# Patient Record
Sex: Male | Born: 1945 | Race: Black or African American | Hispanic: No | Smoking: Current some day smoker
Health system: Southern US, Community
[De-identification: ages and names within clinical notes are randomized; demographics above are authoritative.]

## PROBLEM LIST (undated history)

## (undated) DIAGNOSIS — E119 Type 2 diabetes mellitus without complications: Secondary | ICD-10-CM

## (undated) DIAGNOSIS — N410 Acute prostatitis: Secondary | ICD-10-CM

## (undated) DIAGNOSIS — Z8719 Personal history of other diseases of the digestive system: Secondary | ICD-10-CM

## (undated) DIAGNOSIS — I10 Essential (primary) hypertension: Secondary | ICD-10-CM

## (undated) DIAGNOSIS — I2510 Atherosclerotic heart disease of native coronary artery without angina pectoris: Secondary | ICD-10-CM

## (undated) DIAGNOSIS — K219 Gastro-esophageal reflux disease without esophagitis: Secondary | ICD-10-CM

## (undated) DIAGNOSIS — Z86010 Personal history of colonic polyps: Principal | ICD-10-CM

## (undated) DIAGNOSIS — N179 Acute kidney failure, unspecified: Secondary | ICD-10-CM

## (undated) HISTORY — DX: Acute kidney failure, unspecified: N17.9

## (undated) HISTORY — DX: Acute prostatitis: N41.0

## (undated) HISTORY — DX: Personal history of colonic polyps: Z86.010

---
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
## 1998-02-19 ENCOUNTER — Encounter: Admission: RE | Admit: 1998-02-19 | Discharge: 1998-05-20 | Payer: Self-pay | Admitting: Emergency Medicine

## 2004-06-25 ENCOUNTER — Emergency Department (HOSPITAL_COMMUNITY): Admission: EM | Admit: 2004-06-25 | Discharge: 2004-06-25 | Payer: Self-pay | Admitting: Emergency Medicine

## 2007-01-26 ENCOUNTER — Ambulatory Visit: Payer: Self-pay | Admitting: Internal Medicine

## 2007-01-26 ENCOUNTER — Encounter (INDEPENDENT_AMBULATORY_CARE_PROVIDER_SITE_OTHER): Payer: Self-pay | Admitting: Family Medicine

## 2007-01-26 LAB — CONVERTED CEMR LAB: Microalb, Ur: 6.38 mg/dL — ABNORMAL HIGH (ref 0.00–1.89)

## 2007-02-09 ENCOUNTER — Encounter (INDEPENDENT_AMBULATORY_CARE_PROVIDER_SITE_OTHER): Payer: Self-pay | Admitting: Family Medicine

## 2007-02-09 ENCOUNTER — Ambulatory Visit: Payer: Self-pay | Admitting: Internal Medicine

## 2007-02-09 ENCOUNTER — Ambulatory Visit: Payer: Self-pay | Admitting: *Deleted

## 2007-02-09 LAB — CONVERTED CEMR LAB
ALT: 13 units/L (ref 0–53)
AST: 17 units/L (ref 0–37)
Albumin: 4.2 g/dL (ref 3.5–5.2)
Alkaline Phosphatase: 61 units/L (ref 39–117)
Basophils Absolute: 0.1 10*3/uL (ref 0.0–0.1)
Calcium: 9.7 mg/dL (ref 8.4–10.5)
Chloride: 104 meq/L (ref 96–112)
Cholesterol: 201 mg/dL — ABNORMAL HIGH (ref 0–200)
Eosinophils Absolute: 0.4 10*3/uL (ref 0.2–0.7)
Eosinophils Relative: 6 % — ABNORMAL HIGH (ref 0–5)
Glucose, Bld: 146 mg/dL — ABNORMAL HIGH (ref 70–99)
HCT: 45 % (ref 39.0–52.0)
Hemoglobin: 14.1 g/dL (ref 13.0–17.0)
MCHC: 31.3 g/dL (ref 30.0–36.0)
MCV: 92.6 fL (ref 78.0–100.0)
Platelets: 264 10*3/uL (ref 150–400)
Potassium: 4.7 meq/L (ref 3.5–5.3)
Sodium: 140 meq/L (ref 135–145)
Triglycerides: 172 mg/dL — ABNORMAL HIGH (ref <150)

## 2007-02-18 ENCOUNTER — Encounter (INDEPENDENT_AMBULATORY_CARE_PROVIDER_SITE_OTHER): Payer: Self-pay | Admitting: Family Medicine

## 2007-02-18 ENCOUNTER — Ambulatory Visit: Payer: Self-pay | Admitting: Internal Medicine

## 2007-02-18 LAB — CONVERTED CEMR LAB: Helicobacter Pylori Antibody-IgG: >8 — ABNORMAL HIGH

## 2007-05-27 ENCOUNTER — Ambulatory Visit: Payer: Self-pay | Admitting: Family Medicine

## 2008-01-02 ENCOUNTER — Ambulatory Visit: Payer: Self-pay | Admitting: Internal Medicine

## 2008-01-02 ENCOUNTER — Encounter (INDEPENDENT_AMBULATORY_CARE_PROVIDER_SITE_OTHER): Payer: Self-pay | Admitting: Adult Health

## 2008-01-02 LAB — CONVERTED CEMR LAB
Albumin: 4.6 g/dL (ref 3.5–5.2)
Basophils Relative: 1 % (ref 0–1)
Calcium: 9.7 mg/dL (ref 8.4–10.5)
Chloride: 100 meq/L (ref 96–112)
Cholesterol: 236 mg/dL — ABNORMAL HIGH (ref 0–200)
Eosinophils Absolute: 0.4 10*3/uL (ref 0.0–0.7)
HCT: 44.5 % (ref 39.0–52.0)
HDL: 56 mg/dL (ref >39)
LDL Cholesterol: 148 mg/dL — ABNORMAL HIGH (ref 0–99)
MCHC: 34.4 g/dL (ref 30.0–36.0)
Microalb, Ur: 13.2 mg/dL — ABNORMAL HIGH (ref 0.00–1.89)
Potassium: 4.6 meq/L (ref 3.5–5.3)
RBC: 4.96 M/uL (ref 4.22–5.81)
RDW: 12.7 % (ref 11.5–15.5)
Sodium: 137 meq/L (ref 135–145)
Total Protein: 7.8 g/dL (ref 6.0–8.3)
Triglycerides: 158 mg/dL — ABNORMAL HIGH (ref <150)
WBC: 5.6 10*3/uL (ref 4.0–10.5)

## 2008-01-06 ENCOUNTER — Ambulatory Visit: Payer: Self-pay | Admitting: Internal Medicine

## 2008-02-01 ENCOUNTER — Ambulatory Visit: Payer: Self-pay | Admitting: Internal Medicine

## 2008-04-21 ENCOUNTER — Emergency Department (HOSPITAL_COMMUNITY): Admission: EM | Admit: 2008-04-21 | Discharge: 2008-04-21 | Payer: Self-pay | Admitting: Emergency Medicine

## 2008-05-16 ENCOUNTER — Ambulatory Visit: Payer: Self-pay | Admitting: Internal Medicine

## 2008-08-28 ENCOUNTER — Emergency Department (HOSPITAL_COMMUNITY): Admission: EM | Admit: 2008-08-28 | Discharge: 2008-08-28 | Payer: Self-pay | Admitting: Emergency Medicine

## 2008-10-30 ENCOUNTER — Emergency Department (HOSPITAL_COMMUNITY): Admission: EM | Admit: 2008-10-30 | Discharge: 2008-10-30 | Payer: Self-pay | Admitting: Family Medicine

## 2008-12-25 ENCOUNTER — Encounter (INDEPENDENT_AMBULATORY_CARE_PROVIDER_SITE_OTHER): Payer: Self-pay | Admitting: Adult Health

## 2008-12-25 ENCOUNTER — Ambulatory Visit: Payer: Self-pay | Admitting: Family Medicine

## 2008-12-25 LAB — CONVERTED CEMR LAB
ALT: 26 units/L (ref 0–53)
BUN: 19 mg/dL (ref 6–23)
CO2: 24 meq/L (ref 19–32)
Chloride: 106 meq/L (ref 96–112)
Creatinine, Ser: 1.45 mg/dL (ref 0.40–1.50)
Glucose, Bld: 105 mg/dL — ABNORMAL HIGH (ref 70–99)
Microalb, Ur: 20.94 mg/dL — ABNORMAL HIGH (ref 0.00–1.89)
Total Bilirubin: 0.6 mg/dL (ref 0.3–1.2)
Triglycerides: 108 mg/dL (ref <150)

## 2009-01-08 ENCOUNTER — Ambulatory Visit: Payer: Self-pay | Admitting: Internal Medicine

## 2009-04-12 ENCOUNTER — Ambulatory Visit: Payer: Self-pay | Admitting: Internal Medicine

## 2009-04-12 ENCOUNTER — Encounter (INDEPENDENT_AMBULATORY_CARE_PROVIDER_SITE_OTHER): Payer: Self-pay | Admitting: Adult Health

## 2009-04-12 LAB — CONVERTED CEMR LAB
ALT: 13 units/L (ref 0–53)
Albumin: 4.4 g/dL (ref 3.5–5.2)
Alkaline Phosphatase: 76 units/L (ref 39–117)
CO2: 25 meq/L (ref 19–32)
Sodium: 139 meq/L (ref 135–145)
Total Protein: 7.2 g/dL (ref 6.0–8.3)

## 2009-05-16 ENCOUNTER — Observation Stay (HOSPITAL_COMMUNITY): Admission: EM | Admit: 2009-05-16 | Discharge: 2009-05-17 | Payer: Self-pay | Admitting: Emergency Medicine

## 2009-05-16 ENCOUNTER — Ambulatory Visit: Payer: Self-pay | Admitting: Family Medicine

## 2009-07-25 ENCOUNTER — Encounter: Payer: Self-pay | Admitting: *Deleted

## 2010-04-15 NOTE — Miscellaneous (Signed)
Summary: walk in  [facility] came in asking for a referral. he is a [facility] pt. told him even though we saw him in the hospital, he will need to go back to [facility] for the referral. explained that they are his pcp. he was ok with this.Golden Circle RN  Jul 25, 2009 8:54 AM

## 2010-06-09 LAB — CK TOTAL AND CKMB (NOT AT ARMC)
CK, MB: 1.1 ng/mL (ref 0.3–4.0)
Total CK: 125 U/L (ref 7–232)

## 2010-06-09 LAB — BASIC METABOLIC PANEL
BUN: 10 mg/dL (ref 6–23)
CO2: 27 mEq/L (ref 19–32)
Calcium: 8.4 mg/dL (ref 8.4–10.5)
Calcium: 9 mg/dL (ref 8.4–10.5)
GFR calc Af Amer: >60 mL/min (ref >60)
GFR calc Af Amer: >60 mL/min (ref >60)
GFR calc non Af Amer: 52 mL/min — ABNORMAL LOW (ref >60)
GFR calc non Af Amer: 57 mL/min — ABNORMAL LOW (ref >60)
Sodium: 131 mEq/L — ABNORMAL LOW (ref 135–145)
Sodium: 134 mEq/L — ABNORMAL LOW (ref 135–145)

## 2010-06-09 LAB — RAPID URINE DRUG SCREEN, HOSP PERFORMED
Benzodiazepines: NOT DETECTED
Cocaine: POSITIVE — AB
Opiates: NOT DETECTED
Tetrahydrocannabinol: POSITIVE — AB

## 2010-06-09 LAB — CARDIAC PANEL(CRET KIN+CKTOT+MB+TROPI)
CK, MB: 0.9 ng/mL (ref 0.3–4.0)
Total CK: 100 U/L (ref 7–232)
Troponin I: 0.02 ng/mL (ref 0.00–0.06)
Troponin I: 0.04 ng/mL (ref 0.00–0.06)

## 2010-06-09 LAB — POCT CARDIAC MARKERS: Myoglobin, poc: 73.2 ng/mL (ref 12–200)

## 2010-06-09 LAB — GLUCOSE, CAPILLARY
Glucose-Capillary: 103 mg/dL — ABNORMAL HIGH (ref 70–99)
Glucose-Capillary: 227 mg/dL — ABNORMAL HIGH (ref 70–99)
Glucose-Capillary: 235 mg/dL — ABNORMAL HIGH (ref 70–99)
Glucose-Capillary: 345 mg/dL — ABNORMAL HIGH (ref 70–99)

## 2010-06-09 LAB — CBC
HCT: 37.9 % — ABNORMAL LOW (ref 39.0–52.0)
Hemoglobin: 13.1 g/dL (ref 13.0–17.0)
RDW: 12.8 % (ref 11.5–15.5)
WBC: 4.7 10*3/uL (ref 4.0–10.5)

## 2010-06-09 LAB — TROPONIN I: Troponin I: 0.01 ng/mL (ref 0.00–0.06)

## 2010-06-09 LAB — URINALYSIS, ROUTINE W REFLEX MICROSCOPIC
Glucose, UA: >1000 mg/dL — AB
Hgb urine dipstick: NEGATIVE
Ketones, ur: NEGATIVE mg/dL
Protein, ur: NEGATIVE mg/dL

## 2010-06-09 LAB — LIPID PANEL
HDL: 52 mg/dL (ref >39)
VLDL: 23 mg/dL (ref 0–40)

## 2010-06-09 LAB — DIFFERENTIAL
Basophils Relative: 0 % (ref 0–1)
Eosinophils Relative: 10 % — ABNORMAL HIGH (ref 0–5)
Monocytes Relative: 8 % (ref 3–12)

## 2010-06-21 LAB — GC/CHLAMYDIA PROBE AMP, GENITAL: GC Probe Amp, Genital: NEGATIVE

## 2011-06-28 ENCOUNTER — Emergency Department (HOSPITAL_COMMUNITY): Payer: Self-pay

## 2011-06-28 ENCOUNTER — Emergency Department (HOSPITAL_COMMUNITY)
Admission: EM | Admit: 2011-06-28 | Discharge: 2011-06-28 | Disposition: A | Discharge status: Home / Self Care | Payer: Self-pay | Attending: Emergency Medicine | Admitting: Emergency Medicine

## 2011-06-28 ENCOUNTER — Encounter (HOSPITAL_COMMUNITY): Payer: Self-pay | Admitting: *Deleted

## 2011-06-28 DIAGNOSIS — R0602 Shortness of breath: Secondary | ICD-10-CM | POA: Insufficient documentation

## 2011-06-28 DIAGNOSIS — Z9114 Patient's other noncompliance with medication regimen: Secondary | ICD-10-CM

## 2011-06-28 DIAGNOSIS — R739 Hyperglycemia, unspecified: Secondary | ICD-10-CM

## 2011-06-28 DIAGNOSIS — I10 Essential (primary) hypertension: Secondary | ICD-10-CM | POA: Insufficient documentation

## 2011-06-28 DIAGNOSIS — Z91199 Patient's noncompliance with other medical treatment and regimen due to unspecified reason: Secondary | ICD-10-CM | POA: Insufficient documentation

## 2011-06-28 DIAGNOSIS — I2510 Atherosclerotic heart disease of native coronary artery without angina pectoris: Secondary | ICD-10-CM | POA: Insufficient documentation

## 2011-06-28 DIAGNOSIS — Z79899 Other long term (current) drug therapy: Secondary | ICD-10-CM | POA: Insufficient documentation

## 2011-06-28 DIAGNOSIS — R079 Chest pain, unspecified: Secondary | ICD-10-CM | POA: Insufficient documentation

## 2011-06-28 DIAGNOSIS — E119 Type 2 diabetes mellitus without complications: Secondary | ICD-10-CM | POA: Insufficient documentation

## 2011-06-28 DIAGNOSIS — Z9119 Patient's noncompliance with other medical treatment and regimen: Secondary | ICD-10-CM | POA: Insufficient documentation

## 2011-06-28 HISTORY — DX: Essential (primary) hypertension: I10

## 2011-06-28 HISTORY — DX: Atherosclerotic heart disease of native coronary artery without angina pectoris: I25.10

## 2011-06-28 LAB — CK TOTAL AND CKMB (NOT AT ARMC)
CK, MB: 2.1 ng/mL (ref 0.3–4.0)
Relative Index: 0.7 (ref 0.0–2.5)
Relative Index: 0.8 (ref 0.0–2.5)

## 2011-06-28 LAB — DIFFERENTIAL
Basophils Absolute: 0 10*3/uL (ref 0.0–0.1)
Basophils Relative: 1 % (ref 0–1)
Eosinophils Absolute: 0.4 10*3/uL (ref 0.0–0.7)
Lymphs Abs: 1.3 10*3/uL (ref 0.7–4.0)
Neutrophils Relative %: 61 % (ref 43–77)

## 2011-06-28 LAB — COMPREHENSIVE METABOLIC PANEL
ALT: 14 U/L (ref 0–53)
AST: 24 U/L (ref 0–37)
Albumin: 3.8 g/dL (ref 3.5–5.2)
Alkaline Phosphatase: 89 U/L (ref 39–117)
Calcium: 9.2 mg/dL (ref 8.4–10.5)
Potassium: 3.5 mEq/L (ref 3.5–5.1)
Sodium: 136 mEq/L (ref 135–145)
Total Protein: 6.9 g/dL (ref 6.0–8.3)

## 2011-06-28 LAB — POCT I-STAT TROPONIN I: Troponin i, poc: 0.01 ng/mL (ref 0.00–0.08)

## 2011-06-28 LAB — CBC
MCH: 31.4 pg (ref 26.0–34.0)
MCHC: 35.6 g/dL (ref 30.0–36.0)
Platelets: 231 10*3/uL (ref 150–400)
RBC: 4.33 MIL/uL (ref 4.22–5.81)
RDW: 12.3 % (ref 11.5–15.5)

## 2011-06-28 LAB — RAPID URINE DRUG SCREEN, HOSP PERFORMED
Amphetamines: NOT DETECTED
Barbiturates: NOT DETECTED
Cocaine: POSITIVE — AB
Tetrahydrocannabinol: NOT DETECTED

## 2011-06-28 MED ORDER — ASPIRIN 81 MG PO CHEW: 81.0000 mg | CHEWABLE_TABLET | Freq: Every day | ORAL | Status: AC

## 2011-06-28 MED ORDER — METFORMIN HCL 1000 MG PO TABS: 1000.0000 mg | ORAL_TABLET | Freq: Two times a day (BID) | ORAL | Status: DC

## 2011-06-28 MED ORDER — ASPIRIN 81 MG PO CHEW
324.0000 mg | CHEWABLE_TABLET | Freq: Once | ORAL | Status: AC
  Administered 2011-06-28: 324 mg via ORAL
  Filled 2011-06-28: qty 4

## 2011-06-28 MED ORDER — LISINOPRIL 20 MG PO TABS: 10.0000 mg | ORAL_TABLET | Freq: Every day | ORAL | Status: DC

## 2011-06-28 NOTE — ED Notes (Signed)
Dr. Bosie Clos informed & aware of cxr & UDS results

## 2011-06-28 NOTE — Discharge Instructions (Signed)
Arterial Hypertension Arterial hypertension (high blood pressure) is a condition of elevated pressure in your blood vessels. Hypertension over a long period of time is a risk factor for strokes, heart attacks, and heart failure. It is also the leading cause of kidney (renal) failure.  CAUSES   In Adults -- Over 90% of all hypertension has no known cause. This is called essential or primary hypertension. In the other 10% of people with hypertension, the increase in blood pressure is caused by another disorder. This is called secondary hypertension. Important causes of secondary hypertension are:   Heavy alcohol use.   Obstructive sleep apnea.   Hyperaldosterosim (Conn's syndrome).   Steroid use.   Chronic kidney failure.   Hyperparathyroidism.   Medications.   Renal artery stenosis.   Pheochromocytoma.   Cushing's disease.   Coarctation of the aorta.   Scleroderma renal crisis.   Licorice (in excessive amounts).   Drugs (cocaine, methamphetamine).  Your caregiver can explain any items above that apply to you.  In Children -- Secondary hypertension is more common and should always be considered.   Pregnancy -- Few women of childbearing age have high blood pressure. However, up to 10% of them develop hypertension of pregnancy. Generally, this will not harm the woman. It Even be a sign of 3 complications of pregnancy: preeclampsia, HELLP syndrome, and eclampsia. Follow up and control with medication is necessary.  SYMPTOMS   This condition normally does not produce any noticeable symptoms. It is usually found during a routine exam.   Malignant hypertension is a late problem of high blood pressure. It Monger have the following symptoms:   Headaches.   Blurred vision.   End-organ damage (this means your kidneys, heart, lungs, and other organs are being damaged).   Stressful situations can increase the blood pressure. If a person with normal blood pressure has their blood  pressure go up while being seen by their caregiver, this is often termed "white coat hypertension." Its importance is not known. It Alkire be related with eventually developing hypertension or complications of hypertension.   Hypertension is often confused with mental tension, stress, and anxiety.  DIAGNOSIS  The diagnosis is made by 3 separate blood pressure measurements. They are taken at least 1 week apart from each other. If there is organ damage from hypertension, the diagnosis Lemire be made without repeat measurements. Hypertension is usually identified by having blood pressure readings:  Above 140/90 mmHg measured in both arms, at 3 separate times, over a couple weeks.   Over 130/80 mmHg should be considered a risk factor and Grisanti require treatment in patients with diabetes.  Blood pressure readings over 120/80 mmHg are called "pre-hypertension" even in non-diabetic patients. To get a true blood pressure measurement, use the following guidelines. Be aware of the factors that can alter blood pressure readings.  Take measurements at least 1 hour after caffeine.   Take measurements 30 minutes after smoking and without any stress. This is another reason to quit smoking - it raises your blood pressure.   Use a proper cuff size. Ask your caregiver if you are not sure about your cuff size.   Most home blood pressure cuffs are automatic. They will measure systolic and diastolic pressures. The systolic pressure is the pressure reading at the start of sounds. Diastolic pressure is the pressure at which the sounds disappear. If you are elderly, measure pressures in multiple postures. Try sitting, lying or standing.   Sit at rest for a minimum of   5 minutes before taking measurements.   You should not be on any medications like decongestants. These are found in many cold medications.   Record your blood pressure readings and review them with your caregiver.  If you have hypertension:  Your caregiver  may do tests to be sure you do not have secondary hypertension (see "causes" above).   Your caregiver may also look for signs of metabolic syndrome. This is also called Syndrome X or Insulin Resistance Syndrome. You may have this syndrome if you have type 2 diabetes, abdominal obesity, and abnormal blood lipids in addition to hypertension.   Your caregiver will take your medical and family history and perform a physical exam.   Diagnostic tests may include blood tests (for glucose, cholesterol, potassium, and kidney function), a urinalysis, or an EKG. Other tests may also be necessary depending on your condition.  PREVENTION  There are important lifestyle issues that you can adopt to reduce your chance of developing hypertension:  Maintain a normal weight.   Limit the amount of salt (sodium) in your diet.   Exercise often.   Limit alcohol intake.   Get enough potassium in your diet. Discuss specific advice with your caregiver.   Follow a DASH diet (dietary approaches to stop hypertension). This diet is rich in fruits, vegetables, and low-fat dairy products, and avoids certain fats.  PROGNOSIS  Essential hypertension cannot be cured. Lifestyle changes and medical treatment can lower blood pressure and reduce complications. The prognosis of secondary hypertension depends on the underlying cause. Many people whose hypertension is controlled with medicine or lifestyle changes can live a normal, healthy life.  RISKS AND COMPLICATIONS  While high blood pressure alone is not an illness, it often requires treatment due to its short- and long-term effects on many organs. Hypertension increases your risk for:  CVAs or strokes (cerebrovascular accident).   Heart failure due to chronically high blood pressure (hypertensive cardiomyopathy).   Heart attack (myocardial infarction).   Damage to the retina (hypertensive retinopathy).   Kidney failure (hypertensive nephropathy).  Your caregiver can  explain list items above that apply to you. Treatment of hypertension can significantly reduce the risk of complications. TREATMENT   For overweight patients, weight loss and regular exercise are recommended. Physical fitness lowers blood pressure.   Mild hypertension is usually treated with diet and exercise. A diet rich in fruits and vegetables, fat-free dairy products, and foods low in fat and salt (sodium) can help lower blood pressure. Decreasing salt intake decreases blood pressure in a 1/3 of people.   Stop smoking if you are a smoker.  The steps above are highly effective in reducing blood pressure. While these actions are easy to suggest, they are difficult to achieve. Most patients with moderate or severe hypertension end up requiring medications to bring their blood pressure down to a normal level. There are several classes of medications for treatment. Blood pressure pills (antihypertensives) will lower blood pressure by their different actions. Lowering the blood pressure by 10 mmHg may decrease the risk of complications by as much as 25%. The goal of treatment is effective blood pressure control. This will reduce your risk for complications. Your caregiver will help you determine the best treatment for you according to your lifestyle. What is excellent treatment for one person, may not be for you. HOME CARE INSTRUCTIONS   Do not smoke.   Follow the lifestyle changes outlined in the "Prevention" section.   If you are on medications, follow the directions   carefully. Blood pressure medications must be taken as prescribed. Skipping doses reduces their benefit. It also puts you at risk for problems.   Follow up with your caregiver, as directed.   If you are asked to monitor your blood pressure at home, follow the guidelines in the "Diagnosis" section above.  SEEK MEDICAL CARE IF:   You think you are having medication side effects.   You have recurrent headaches or lightheadedness.     You have swelling in your ankles.   You have trouble with your vision.  SEEK IMMEDIATE MEDICAL CARE IF:   You have sudden onset of chest pain or pressure, difficulty breathing, or other symptoms of a heart attack.   You have a severe headache.   You have symptoms of a stroke (such as sudden weakness, difficulty speaking, difficulty walking).  MAKE SURE YOU:   Understand these instructions.   Will watch your condition.   Will get help right away if you are not doing well or get worse.  Document Released: 03/02/2005 Document Revised: 02/19/2011 Document Reviewed: 09/30/2006 [employer] Patient InformatBlood Sugar Monitoring, Adult GLUCOSE METERS FOR SELF-MONITORING OF BLOOD GLUCOSE  It is important to be able to correctly measure your blood sugar (glucose). You can use a blood glucose monitor (a small battery-operated device) to check your glucose level at any time. This allows you and your caregiver to monitor your diabetes and to determine how well your treatment plan is working. The process of monitoring your blood glucose with a glucose meter is called self-monitoring of blood glucose (SMBG). When people with diabetes control their blood sugar, they have better health. To test for glucose with a typical glucose meter, place the disposable strip in the meter. Then place a small sample of blood on the "test strip." The test strip is coated with chemicals that combine with glucose in blood. The meter measures how much glucose is present. The meter displays the glucose level as a number. Several new models can record and store a number of test results. Some models can connect to personal computers to store test results or print them out.  Newer meters are often easier to use than older models. Some meters allow you to get blood from places other than your fingertip. Some new models have automatic timing, error codes, signals, or barcode readers to help with proper adjustment (calibration). Some  meters have a large display screen or spoken instructions for people with visual impairments.  INSTRUCTIONS FOR USING GLUCOSE METERS  Wash your hands with soap and warm water, or clean the area with alcohol. Dry your hands completely.   Prick the side of your fingertip with a lancet (a sharp-pointed tool used by hand).   Hold the hand down and gently milk the finger until a small drop of blood appears. Catch the blood with the test strip.   Follow the instructions for inserting the test strip and using the SMBG meter. Most meters require the meter to be turned on and the test strip to be inserted before applying the blood sample.   Record the test result.   Read the instructions carefully for both the meter and the test strips that go with it. Meter instructions are found in the user manual. Keep this manual to help you solve any problems that may arise. Many meters use "error codes" when there is a problem with the meter, the test strip, or the blood sample on the strip. You will need the manual to understand these error  codes and fix the problem.   New devices are available such as laser lancets and meters that can test blood taken from "alternative sites" of the body, other than fingertips. However, you should use standard fingertip testing if your glucose changes rapidly. Also, use standard testing if:   You have eaten, exercised, or taken insulin in the past 2 hours.   You think your glucose is low.   You tend to not feel symptoms of low blood glucose (hypoglycemia).   You are ill or under stress.   Clean the meter as directed by the manufacturer.   Test the meter for accuracy as directed by the manufacturer.   Take your meter with you to your caregiver's office. This way, you can test your glucose in front of your caregiver to make sure you are using the meter correctly. Your caregiver can also take a sample of blood to test using a routine lab method. If values on the glucose  meter are close to the lab results, you and your caregiver will see that your meter is working well and you are using good technique. Your caregiver will advise you about what to do if the results do not match.  FREQUENCY OF TESTING  Your caregiver will tell you how often you should check your blood glucose. This will depend on your type of diabetes, your current level of diabetes control, and your types of medicines. The following are general guidelines, but your care plan may be different. Record all your readings and the time of day you took them for review with your caregiver.   Diabetes type 1.   When you are using insulin with good diabetic control (either multiple daily injections or via a pump), you should check your glucose 4 times a day.   If your diabetes is not well controlled, you may need to monitor more frequently, including before meals and 2 hours after meals, at bedtime, and occasionally between 2 a.m. and 3 a.m.   You should always check your glucose before a dose of insulin or before changing the rate on your insulin pump.   Diabetes type 2.   Guidelines for SMBG in diabetes type 2 are not as well defined.   If you are on insulin, follow the guidelines above.   If you are on medicines, but not insulin, and your glucose is not well controlled, you should test at least twice daily.   If you are not on insulin, and your diabetes is controlled with medicines or diet alone, you should test at least once daily, usually before breakfast.   A weekly profile will help your caregiver advise you on your care plan. The week before your visit, check your glucose before a meal and 2 hours after a meal at least daily. You may want to test before and after a different meal each day so you and your caregiver can tell how well controlled your blood sugars are throughout the course of a 24 hour period.   Gestational diabetes (diabetes during pregnancy).   Frequent testing is often  necessary. Accurate timing is important.   If you are not on insulin, check your glucose 4 times a day. Check it before breakfast and 1 hour after the start of each meal.   If you are on insulin, check your glucose 6 times a day. Check it before each meal and 1 hour after the first bite of each meal.   General guidelines.   More frequent testing is  required at the start of insulin treatment. Your caregiver will instruct you.   Test your glucose any time you suspect you have low blood sugar (hypoglycemia).   You should test more often when you change medicines, when you have unusual stress or illness, or in other unusual circumstances.  OTHER THINGS TO KNOW ABOUT GLUCOSE METERS  Measurement Range. Most glucose meters are able to read glucose levels over a broad range of values from as low as 0 to as high as 600 mg/dL. If you get an extremely high or low reading from your meter, you should first confirm it with another reading. Report very high or very low readings to your caregiver.   Whole Blood Glucose versus Plasma Glucose. Some older home glucose meters measure glucose in your whole blood. In a lab or when using some newer home glucose meters, the glucose is measured in your plasma (one component of blood). The difference can be important. It is important for you and your caregiver to know whether your meter gives its results as "whole blood equivalent" or "plasma equivalent."   Display of High and Low Glucose Values. Part of learning how to operate a meter is understanding what the meter results mean. Know how high and low glucose concentrations are displayed on your meter.   Factors that Affect Glucose Meter Performance. The accuracy of your test results depends on many factors and varies depending on the brand and type of meter. These factors include:   Low red blood cell count (anemia).   Substances in your blood (such as uric acid, vitamin C, and others).   Environmental factors  (temperature, humidity, altitude).   Name-brand versus generic test strips.   Calibration. Make sure your meter is set up properly. It is a good idea to do a calibration test with a control solution recommended by the manufacturer of your meter whenever you begin using a fresh bottle of test strips. This will help verify the accuracy of your meter.   Improperly stored, expired, or defective test strips. Keep your strips in a dry place with the lid on.   Soiled meter.   Inadequate blood sample.  NEW TECHNOLOGIES FOR GLUCOSE TESTING Alternative site testing Some glucose meters allow testing blood from alternative sites. These include the:  Upper arm.   Forearm.   Base of the thumb.   Thigh.  Sampling blood from alternative sites may be desirable. However, it may have some limitations. Blood in the fingertips show changes in glucose levels more quickly than blood in other parts of the body. This means that alternative site test results may be different from fingertip test results, not because of the meter's ability to test accurately, but because the actual glucose concentration can be different.  Continuous Glucose Monitoring Devices to measure your blood glucose continuously are available, and others are in development. These methods can be more expensive than self-monitoring with a glucose meter. However, it is uncertain how effective and reliable these devices are. Your caregiver will advise you if this approach makes sense for you. IF BLOOD SUGARS ARE CONTROLLED, PEOPLE WITH DIABETES REMAIN HEALTHIER.  SMBG is an important part of the treatment plan of patients with diabetes mellitus. Below are reasons for using SMBG:   It confirms that your glucose is at a specific, healthy level.   It detects hypoglycemia and severe hyperglycemia.   It allows you and your caregiver to make adjustments in response to changes in lifestyle for individuals requiring medicine.  It determines the  need for starting insulin therapy in temporary diabetes that happens during pregnancy (gestational diabetes).  Document Released: 03/05/2003 Document Revised: 02/19/2011 Document Reviewed: 06/26/2010 [facility] Patient Information 2012 [place], [place].Chest Pain (Nonspecific) It is often hard to give a specific diagnosis for the cause of chest pain. There is always a chance that your pain could be related to something serious, such as a heart attack or a blood clot in the lungs. You need to follow up with your caregiver for further evaluation. CAUSES   Heartburn.   Pneumonia or bronchitis.   Anxiety or stress.   Inflammation around your heart (pericarditis) or lung (pleuritis or pleurisy).   A blood clot in the lung.   A collapsed lung (pneumothorax). It can develop suddenly on its own (spontaneous pneumothorax) or from injury (trauma) to the chest.   Shingles infection (herpes zoster virus).  The chest wall is composed of bones, muscles, and cartilage. Any of these can be the source of the pain.  The bones can be bruised by injury.   The muscles or cartilage can be strained by coughing or overwork.   The cartilage can be affected by inflammation and become sore (costochondritis).  DIAGNOSIS  Lab tests or other studies, such as X-rays, electrocardiography, stress testing, or cardiac imaging, may be needed to find the cause of your pain.  TREATMENT   Treatment depends on what may be causing your chest pain. Treatment may include:   Acid blockers for heartburn.   Anti-inflammatory medicine.   Pain medicine for inflammatory conditions.   Antibiotics if an infection is present.   You may be advised to change lifestyle habits. This includes stopping smoking and avoiding alcohol, caffeine, and chocolate.   You may be advised to keep your head raised (elevated) when sleeping. This reduces the chance of acid going backward from your stomach into your esophagus.   Most of the time,  nonspecific chest pain will improve within 2 to 3 days with rest and mild pain medicine.  HOME CARE INSTRUCTIONS   If antibiotics were prescribed, take your antibiotics as directed. Finish them even if you start to feel better.   For the next few days, avoid physical activities that bring on chest pain. Continue physical activities as directed.   Do not smoke.   Avoid drinking alcohol.   Only take over-the-counter or prescription medicine for pain, discomfort, or fever as directed by your caregiver.   Follow your caregiver's suggestions for further testing if your chest pain does not go away.   Keep any follow-up appointments you made. If you do not go to an appointment, you could develop lasting (chronic) problems with pain. If there is any problem keeping an appointment, you must call to reschedule.  SEEK MEDICAL CARE IF:   You think you are having problems from the medicine you are taking. Read your medicine instructions carefully.   Your chest pain does not go away, even after treatment.   You develop a rash with blisters on your chest.  SEEK IMMEDIATE MEDICAL CARE IF:   You have increased chest pain or pain that spreads to your arm, neck, jaw, back, or abdomen.   You develop shortness of breath, an increasing cough, or you are coughing up blood.   You have severe back or abdominal pain, feel nauseous, or vomit.   You develop severe weakness, fainting, or chills.   You have a fever.  THIS IS AN EMERGENCY. Do not wait to see if the  pain will go away. Get medical help at once. Call your local emergency services (911 in [place] Do not drive yourself to the hospital. MAKE SURE YOU:   Understand these instructions.   Will watch your condition.   Will get help right away if you are not doing well or get worse.  Document Released: 12/10/2004 Document Revised: 02/19/2011 Document Reviewed: 10/06/2007 [facility] Patient Information 2012 [place], [place].Aspirin and Your  Heart Aspirin affects the way your blood clots and helps "thin" the blood. Aspirin has many uses in heart disease. It may be used as a primary prevention to help reduce the risk of heart related events. It also can be used as a secondary measure to prevent more heart attacks or to prevent additional damage from blood clots.  ASPIRIN MAY HELP IF YOU:  Have had a heart attack or chest pain.   Have undergone open heart surgery such as CABG (Coronary Artery Bypass Surgery).   Have had coronary angioplasty with or without stents.   Have experienced a stroke or TIA (transient ischemic attack).   Have peripheral vascular disease (PAD).   Have chronic heart rhythm problems such as atrial fibrillation.   Are at risk for heart disease.  BEFORE STARTING ASPIRIN Before you start taking aspirin, your caregiver will need to review your medical history. Many things will need to be taken into consideration, such as:  Smoking status.   Blood pressure.   Diabetes.   Gender.   Weight.   Cholesterol level.  ASPIRIN DOSES  Aspirin should only be taken on the advice of your caregiver. Talk to your caregiver about how much aspirin you should take. Aspirin comes in different doses such as:   81 mg.   162 mg.   325 mg.   The aspirin dose you take may be affected by many factors, some of which include:   Your current medications, especially if your are taking blood-thinners or anti-platelet medicine.   Liver function.   Heart disease risk.   Age.   Aspirin comes in two forms:   Non-enteric-coated. This type of aspirin does not have a coating and is absorbed faster. Non-enteric coated aspirin is recommended for patients experiencing chest pain symptoms. This type of aspirin also comes in a chewable form.   Enteric-coated. This means the aspirin has a special coating that releases the medicine very slowly. Enteric-coated aspirin causes less stomach upset. This type of aspirin should not be  chewed or crushed.  ASPIRIN SIDE EFFECTS Daily use of aspirin can increase your risk of serious side effects, some of these include:  Increased bleeding. This can range from a cut that does not stop bleeding to more serious problems such as stomach bleeding or bleeding into the brain (Intracerebral bleeding).   Increased bruising.   Stomach upset.   An allergic reaction such as red, itchy skin.   Increased risk of bleeding when combined with non-steroidal anti-inflammatory medicine (NSAIDS).   Alcohol should be drank in moderation when taking aspirin. Alcohol can increase the risk of stomach bleeding when taken with aspirin.   Aspirin should not be given to children less than 42 years of age due to the association of Reye syndrome. Reye syndrome is a serious illness that can affect the brain and liver. Studies have linked Reye syndrome with aspirin use in children.   People that have nasal polyps have an increased risk of developing an aspirin allergy.  SEEK MEDICAL CARE IF:   You develop an allergic reaction such  as:   Hives.   Itchy skin.   Swelling of the lips, tongue or face.   You develop stomach pain.   You have unusual bleeding or bruising.   You have ringing in your ears.  SEEK IMMEDIATE MEDICAL CARE IF:   You have severe chest pain, especially if the pain is crushing or pressure-like and spreads to the arms, back, neck, or jaw. THIS IS AN EMERGENCY. Do not wait to see if the pain will go away. Get medical help at once. Call your local emergency services (911 in the [place] DO NOT drive yourself to the hospital.   You have stroke-like symptoms such as:   Loss of vision.   Difficulty talking.   Numbness or weakness on one side of your body.   Numbness or weakness in your arm or leg.   Not thinking clearly or feeling confused.   Your bowel movements are bloody, dark red or black in color.   You vomit or cough up blood.   You have blood in your urine.   You  have shortness of breath, coughing or wheezing.  MAKE SURE YOU:   Understand these instructions.   Will monitor your condition.   Seek immediate medical care if necessary.  Document Released: 02/13/2008 Document Revised: 02/19/2011 Document Reviewed: 02/13/2008 [facility] Patient Information 2012 [place], [place].Cardiac Biomarkers Cardiac biomarkers are enzymes, proteins, and hormones that are associated with heart function, damage or failure. Some of the tests are specific for the heart while others are also elevated with skeletal muscle damage. Cardiac biomarkers are used for diagnostic and prognostic purposes and are frequently ordered by caregivers when someone comes into the Emergency Room complaining of symptoms, such as chest pain, pressure, nausea, and shortness of breath. These tests are ordered, along with other laboratory and non-laboratory tests, to detect heart failure (which is often a chronic, progressive condition affecting the ability of the heart to fill with blood and pump efficiently) and the acute coronary syndromes (ACS) as well as to help determine prognosis for people who have had a heart attack. ACS is a group of symptoms that reflect a sudden decrease in the amount of blood and oxygen, also termed 'ischemia,' reaching the heart. This decrease is frequently due to either a narrowing of the coronary arteries (atherosclerosis or vessel spasm) or unstable plaques, which can cause a blood clot (thrombus) and blockage of blood flow. If the oxygen supply is low, it can cause angina (pain); if blood flow is reduced, it can cause death of heart cells (called myocardial infarction or heart attack) and can lead to death of the affected heart muscle cells and to permanent damage and scarring of the heart.  The goal with cardiac biomarkers is to be able to detect the presence and severity of an acute heart condition as soon as possible so that appropriate treatment can be initiated.  There are  only a few cardiac biomarkers that are being routinely used by physicians. Some have been phased out because they are not as specific as the marker of choice - troponin. Many other potential cardiac biomarkers are still being researched but their clinical utility has yet to be established.  Note: Cardiac biomarkers are not the same tests as those that are used to screen the general healthy population for their risk of developing heart disease. Those can be found under Cardiac Risk Assessment. LABORATORY TESTS CURRENT CARDIAC BIOMARKERS   CK and CK-MB   BNP or (NT-proBNP)   Troponin  Myoglobin (not always used; sometimes ordered with troponin)  MORE GENERAL TESTS FREQUENTLY ORDERED ALONG WITH CARDIAC BIOMARKERS   Blood Gases   CMP   BMP   Electrolytes   CBC  ON THE HORIZON Ischemia modified albumin (IMA) - Test has received FDA approval for use with troponin and electrocardiogram to rule out acute coronary syndrome (ACS) in patients with chest pain. May become useful for identifying patients at higher risk of heart attack and potentially could replace myoglobin one day.  NON-LABORATORY TESTS These tests allow caregivers to look at the size, shape, and function of the heart as it is beating. They can be used to detect changes to the rhythm of the heart as well as to detect and evaluate damaged tissues and blocked arteries.   EKG (ECG, electrocardiogram)   Coronary angiography (or arteriography)   Stress testing   Nuclear scan   ECG (echocardiogram)   Chest X-ray  THE FOLLOWING SUMMARIZES CURRENTLY USED CARDIAC BIOMARKERS. Marker: CK  What: Enzyme that exists in three different isoforms   Where Found: Heart, brain, and skeletal muscle   What Indicates: Injury to muscle cells   Time to Increase: 4 to 6 hours after injury, peaks in 18 to 24 hours   Time back to Normal: Normal in 48 to 72 hours, unless due to continuing injury   When/How Used: Being phased out, may be  ordered prior to CK-MB  Marker: CK-MB  What: Heart- related portion of total CK enzyme   Where Found: Heart primarily, but also in skeletal muscle   What Indicates: Injury (cell death) to heart   Time to Increase: 4 to 6 hrs after heart attack, peaks in 12 to 20 hours   Time back to Normal: Returns to normal in 24 to 48 hours unless new/continual damage   When/How Used: Not as specific as Troponin for heart injury/attack, may be ordered when Troponin is not available, may be ordered to monitor new/continuing damage  Marker: Myoglobin  What: Small oxygen-storing protein   Where Found: Heart and other muscle cells   What Indicates: Injury to heart or other muscle cells. Also elevated with kidney problems.   Time to Increase: Starts to rise within 2 to 3 hours, peaks in 8 to 12 hours.   Time back to Normal: Falls back to normal by about one day after injury occurred   When/How Used: Ordered along with Troponin, helps diagnose heart injury/attack  Marker: Cardiac Troponin  What: Components of a Regulatory protein complex. Two cardiac specific isoforms: T and I   Where Found: Heart muscle   What Indicates: Heart injury/damage   Time to Increase: 4 to 8 hours   Time back to Normal: Remains elevated for 7 to 14 days   When/How Used: Ordered to help assess prognosis and diagnose heart attack  Marker: LDH  What: Enzyme   Where Found: Almost all body tissues   What Indicates: General marker of injury to cells   When/How Used: Phased out, not specific  Marker: AST  What: Enzyme   Where Found: Almost all body tissues   What Indicates: General marker of injury to cells   When/How Used: Phased out, not specific  Marker: Hs-CRP  What: Protein   Where Found: Associated with athero-sclerosis   What Indicates: Inflammatory process   Time back to Normal: Elevated with inflammation   When/How Used: May help determine prognosis of patients who have had heart attack    Marker: BNP  What: Hormone  Where Found: Heart's left ventricle   What Indicates: Heart failure   Time back to Normal: Elevation related to severity   When/How Used: Help diagnose and evaluate heart failure, prognosis, and to monitor therapy  Document Released: 03/25/2004 Document Revised: 02/19/2011 Document Reviewed: 12/10/2004 [facility] Patient Information 2012 [place], [place].Diabetes, Frequently Asked Questions WHAT IS DIABETES? Most of the food we eat is turned into glucose (sugar). Our bodies use it for energy. The pancreas makes a hormone called insulin. It helps glucose get into the cells of our bodies. When you have diabetes, your body either does not make enough insulin or cannot use its own insulin as well as it should. This causes sugars to build up in your blood. WHAT ARE THE SYMPTOMS OF DIABETES?  Frequent urination.   Excessive thirst.   Unexplained weight loss.   Extreme hunger.   Blurred vision.   Tingling or numbness in hands or feet.   Feeling very tired much of the time.   Dry, itchy skin.   Sores that are slow to heal.   Yeast infections.  WHAT ARE THE TYPES OF DIABETES? Type 1 Diabetes   About 10% of affected people have this type.   Usually occurs before the age of 10.   Usually occurs in thin to normal weight people.  Type 2 Diabetes  About 90% of affected people have this type.   Usually occurs after the age of 68.   Usually occurs in overweight people.   More likely to have:   A family history of diabetes.   A history of diabetes during pregnancy (gestational diabetes).   High blood pressure.   High cholesterol and triglycerides.  Gestational Diabetes  Occurs in about 4% of pregnancies.   Usually goes away after the baby is born.   More likely to occur in women with:   Family history of diabetes.   Previous gestational diabetes.   Obese.   Over 67 years old.  WHAT IS PRE-DIABETES? Pre-diabetes means your blood  glucose is higher than normal, but lower than the diabetes range. It also means you are at risk of getting type 2 diabetes and heart disease. If you are told you have pre-diabetes, have your blood glucose checked again in 1 to 2 years. WHAT IS THE TREATMENT FOR DIABETES? Treatment is aimed at keeping blood glucose near normal levels at all times. Learning how to manage this yourself is important in treating diabetes. Depending on the type of diabetes you have, your treatment will include one or more of the following:  Monitoring your blood glucose.   Meal planning.   Exercise.   Oral medicine (pills) or insulin.  CAN DIABETES BE PREVENTED? With type 1 diabetes, prevention is more difficult, because the triggers that cause it are not yet known. With type 2 diabetes, prevention is more likely, with lifestyle changes:  Maintain a healthy weight.   Eat healthy.   Exercise.  IS THERE A CURE FOR DIABETES? No, there is no cure for diabetes. There is a lot of research going on that is looking for a cure, and progress is being made. Diabetes can be treated and controlled. People with diabetes can manage their diabetes and lead normal, active lives. SHOULD I BE TESTED FOR DIABETES? If you are at least 65 years old, you should be tested for diabetes. You should be tested again every 3 years. If you are 45 or older and overweight, you may want to get tested more often. If you are  younger than 45, overweight, and have one or more of the following risk factors, you should be tested:  Family history of diabetes.   Inactive lifestyle.   High blood pressure.  WHAT ARE SOME OTHER SOURCES FOR INFORMATION ON DIABETES? The following organizations may help in your search for more information on diabetes: National Diabetes Education Program (NDEP) Internet: [employer] American Diabetes Association Internet: http://www.diabetes.org  Juvenile Diabetes Foundation  International Internet: [employer] Document Released: 03/05/2003 Document Revised: 02/19/2011 Document Reviewed: 12/28/2008 [facility] Patient Information 2012 [place], [place].Diabetes, Type 2 Diabetes is a long-lasting (chronic) disease. In type 2 diabetes, the pancreas does not make enough insulin (a hormone), and the body does not respond normally to the insulin that is made. This type of diabetes was also previously called adult-onset diabetes. It usually occurs after the age of 65, but it can occur at any age.  CAUSES  Type 2 diabetes happens because the pancreasis not making enough insulin or your body has trouble using the insulin that your pancreas does make properly. SYMPTOMS   Drinking more than usual.   Urinating more than usual.   Blurred vision.   Dry, itchy skin.   Frequent infections.   Feeling more tired than usual (fatigue).  DIAGNOSIS The diagnosis of type 2 diabetes is usually made by one of the following tests:  Fasting blood glucose test. You will not eat for at least 8 hours and then take a blood test.   Random blood glucose test. Your blood glucose (sugar) is checked at any time of the day regardless of when you ate.   Oral glucose tolerance test (OGTT). Your blood glucose is measured after you have not eaten (fasted) and then after you drink a glucose containing beverage.  TREATMENT   Healthy eating.   Exercise.   Medicine, if needed.   Monitoring blood glucose.   Seeing your caregiver regularly.  HOME CARE INSTRUCTIONS   Check your blood glucose at least once a day. More frequent monitoring may be necessary, depending on your medicines and on how well your diabetes is controlled. Your caregiver will advise you.   Take your medicine as directed by your caregiver.   Do not smoke.   Make wise food choices. Ask your caregiver for information. Weight loss can improve your diabetes.   Learn about low blood glucose (hypoglycemia) and how to  treat it.   Get your eyes checked regularly.   Have a yearly physical exam. Have your blood pressure checked and your blood and urine tested.   Wear a pendant or bracelet saying that you have diabetes.   Check your feet every night for cuts, sores, blisters, and redness. Let your caregiver know if you have any problems.  SEEK MEDICAL CARE IF:   You have problems keeping your blood glucose in target range.   You have problems with your medicines.   You have symptoms of an illness that do not improve after 24 hours.   You have a sore or wound that is not healing.   You notice a change in vision or a new problem with your vision.   You have a fever.  MAKE SURE YOU:  Understand these instructions.   Will watch your condition.   Will get help right away if you are not doing well or get worse.  Document Released: 03/02/2005 Document Revised: 02/19/2011 Document Reviewed: 08/18/2010 [facility] Patient Information [address]ion 2012 [place], [place].

## 2011-06-28 NOTE — Consult Note (Signed)
Hospital Admission Note Date: 06/28/2011  Patient name: Leonard Stone Medical record number: [id number] Date of birth: 07-03-1945 Age: 66 y.o. Gender: male PCP: No primary provider on file.  Medical Service: Internal Medicine Teaching Service  Attending physician:  Dr. Josem Kaufmann   1st Contact: Dr. Bosie Clos   Pager: [phone] 2nd Contact: Dr. Anselm Jungling    Pager:[phone] After 5 pm or weekends: 1st Contact:      Pager: [phone] 2nd Contact:      Pager: [phone]  Chief Complaint: chest pain  History of Present Illness: Pt was in his typical state of health until 12:30 on the morning of admission when he began experiencing chest pain that he described as a pressure at the center of his chest.  Notably, he reports using cocaine three hours prior to onset and Vicodin 30 minutes from the onset.  The pain does no radiate, there are no exacerbating factors (including movement), and no alleviating factors (including rest).  He claims that the pain remained constant until he arrived at the ED around 0400.  In the ED he received only 324 mg of Aspirin and his pain gradually subsided.  Currently without any pain.  He denies shortness of breath, cough, sweating, fever, nausea/vomiting, lower extremity swelling/pain, and dizziness.   Meds: Medications Prior to Admission  Medication Sig Dispense Refill  . ranitidine (ZANTAC) 150 MG tablet Take 150 mg by mouth daily.      Marland Kitchen lisinopril (PRINIVIL,ZESTRIL) 20 MG tablet Take 0.5 tablets (10 mg total) by mouth daily.  30 tablet  1  . metFORMIN (GLUCOPHAGE) 1000 MG tablet Take 1 tablet (1,000 mg total) by mouth 2 (two) times daily with a meal.  60 tablet  1   Medications Prior to Admission  Medication Dose Route Frequency Provider Last Rate Last Dose  . aspirin chewable tablet 324 mg  324 mg Oral Once Felisa Bonier, MD   324 mg at 06/28/11 6578    Allergies: Allergies as of 06/28/2011  . (No Known Allergies)   Past Medical History  Diagnosis Date  . Hypertension    . Coronary artery disease   . Diabetes mellitus    History reviewed. No pertinent past surgical history. No family history on file. History   Social History  . Marital Status: Legally Separated    Spouse Name: N/A    Number of Children: N/A  . Years of Education: N/A   Occupational History  . Not on file.   Social History Main Topics  . Smoking status: Current Everyday Smoker  . Smokeless tobacco: Not on file  . Alcohol Use: Yes  . Drug Use:   . Sexually Active:    Other Topics Concern  . Not on file   Social History Narrative  . No narrative on file    Review of Systems: Gen: No fevers, no fatigue HEENT: No headaches, no sore throat CV: Positive Chest Pain, no palpitations, no DOE Resp: No SOB, no cough ABD: No abdominal pain, no nausea, no vomiting, no changes in bowel habits Urinary: No dysuria, no increase frequency Ext: no extremity swelling, positive foot pain  Physical Exam: Blood pressure 135/82, pulse 74, temperature 98 F (36.7 C), temperature source Oral, resp. rate 16, SpO2 100.00%. BP 135/82  Pulse 74  Temp(Src) 98 F (36.7 C) (Oral)  Resp 16  SpO2 100% General appearance: alert and cooperative Head: Normocephalic, without obvious abnormality, atraumatic Eyes: conjunctivae/corneas clear. PERRL, EOM's intact. Fundi benign. Nose: no discharge Throat: lips, mucosa, and tongue  normal; teeth and gums normal Neck: no carotid bruit, no JVD and supple, symmetrical, trachea midline Lungs: clear to auscultation bilaterally Chest wall: no tenderness Heart: regular rate and rhythm, S1, S2 normal, no murmur, click, rub or gallop Abdomen: soft, non-tender; bowel sounds normal; no masses,  no organomegaly Extremities: extremities normal, atraumatic, no cyanosis or edema Pulses: 2+ and symmetric Skin: Skin color, texture, turgor normal. No rashes or lesions Neurologic: Grossly normal, alert and oriented x3 Psych: Appropriate Mood and Affect  Lab  results: Basic Metabolic Panel:  Basename 06/28/11 0453  NA 136  K 3.5  CL 101  CO2 24  GLUCOSE 261*  BUN 11  CREATININE 1.18  CALCIUM 9.2  MG --  PHOS --   Liver Function Tests:  [facility] 06/28/11 0453  AST 24  ALT 14  ALKPHOS 89  BILITOT 0.6  PROT 6.9  ALBUMIN 3.8   CBC:  Basename 06/28/11 0453  WBC 5.5  NEUTROABS 3.3  HGB 13.6  HCT 38.2*  MCV 88.2  PLT 231   Cardiac Enzymes:  Basename 06/28/11 0734 06/28/11 0453  CKTOTAL 266* 301*  CKMB 2.1 2.2  CKMBINDEX -- --  TROPONINI -- --   Urine Drug Screen: Drugs of Abuse     Component Value Date/Time   LABOPIA NONE DETECTED 06/28/2011 1038   COCAINSCRNUR POSITIVE* 06/28/2011 1038   LABBENZ NONE DETECTED 06/28/2011 1038   AMPHETMU NONE DETECTED 06/28/2011 1038   THCU NONE DETECTED 06/28/2011 1038   LABBARB NONE DETECTED 06/28/2011 1038    Imaging results:  Dg Chest 2 View  06/28/2011  *RADIOLOGY REPORT*  Clinical Data: Chest pain  CHEST - 2 VIEW  Comparison: 05/16/2009  Findings: The cardiomediastinal silhouette is unremarkable. The lungs are clear. There is no evidence of focal airspace disease, pulmonary edema, suspicious pulmonary nodule/mass, pleural effusion, or pneumothorax. No acute bony abnormalities are identified.  IMPRESSION: No evidence of acute cardiopulmonary disease.  Original Report Authenticated By: Rosendo Gros, M.D.    Other results: EKG: normal EKG, normal sinus rhythm, unchanged from previous tracings.  Assessment & Plan by Problem: 1) Chest Pain: Most likely cocaine induced pain.  Two negative sets of cardiac enzymes suggest the pain is less likely due to myocardial infarction.  Pt is without shortness of breath, dyspnea on exertion nor cough with Geneva score of 0 indicating very low probability of pulmonary embolism. In addition, his chest XRay was without acute findings thus pulmonary origin, aortic defects and heart failure is unlikely.   The description of the pain and the fact that it  has resolved so acutely makes an MSK etiology unlikely.  He was advised to be allow admission to further assess his cardiac status but pt declined specifying that he felt fine with total resolution of symptoms "hours ago" and the need to return to work and get his car out of the vehicle impound. Pt voiced understanding of medical advisement to be admitted to the hospital for a cardiac rule out and decided to leave against medical advise.  He voiced understanding that leaving AMA could result in death, deterioration of his condition, and worsened cardiac and pulmonary morbidity.  He was seen by the social worker in the ED. He was instructed to seek resources to help him stop using cocaine and to continue his home medications as prescribed, including a daily 81 mg Aspirin.  He was further instructed to seek immediate medical attention if his condition worsened, his chest pain returned, or if he became short of  breath.   He was instructed to follow-up with [facility] for the next available appointment  And voice understanding.       Signed: HO,MICHELE 06/28/2011, 12:57 PM

## 2011-06-28 NOTE — Progress Notes (Signed)
06/28/11 0939  Discharge Planning  Type of Residence Private residence  Living Arrangements Alone  Home Care Services No  Support Systems Other relatives  Do you have any problems obtaining your medications? Yes (Describe) (Per EMR, pt is unfunded)  Once you are discharged, how will you get to your follow-up appointment? Self  Expected Discharge Date 07/01/11  Case Management Consult Needed Yes (Comment)  Social Work Consult Needed No    Dionne Milo MSW [facility] Emergency Dept. Weekend/Social Worker [phone]
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
## 2011-06-28 NOTE — ED Notes (Signed)
The pt has had lt upper chest pain for the past 3 hours.  bp high

## 2011-06-28 NOTE — ED Notes (Signed)
Patient transported to X-ray 

## 2011-06-28 NOTE — ED Notes (Signed)
Report received, assumed care.  

## 2011-06-28 NOTE — ED Notes (Signed)
Paged [phone] to [phone] 

## 2011-06-28 NOTE — ED Notes (Signed)
Pt c/o intermittent substernal CP since taking one of his pain pills. Pain fills like pressure, with mild nausea. Denies vomiting, diaphoresis. Nothing makes the pain feel worse and nothing makes the pain feel better

## 2011-06-28 NOTE — ED Notes (Signed)
Admitting MD at bedside.

## 2011-06-28 NOTE — ED Notes (Signed)
Diet received

## 2011-06-29 NOTE — Consult Note (Signed)
Leonard Stone left against medical advice before I could interview and examine him.

## 2012-07-04 ENCOUNTER — Emergency Department (HOSPITAL_COMMUNITY): Payer: Medicare Other

## 2012-07-04 ENCOUNTER — Encounter: Payer: Self-pay | Admitting: Emergency Medicine

## 2012-07-04 ENCOUNTER — Emergency Department (HOSPITAL_COMMUNITY)
Admission: EM | Admit: 2012-07-04 | Discharge: 2012-07-04 | Discharge status: Against Medical Advice | Payer: Medicare Other | Attending: Emergency Medicine | Admitting: Emergency Medicine

## 2012-07-04 ENCOUNTER — Encounter (HOSPITAL_COMMUNITY): Payer: Self-pay | Admitting: *Deleted

## 2012-07-04 DIAGNOSIS — Y9289 Other specified places as the place of occurrence of the external cause: Secondary | ICD-10-CM | POA: Insufficient documentation

## 2012-07-04 DIAGNOSIS — I10 Essential (primary) hypertension: Secondary | ICD-10-CM | POA: Insufficient documentation

## 2012-07-04 DIAGNOSIS — Y9389 Activity, other specified: Secondary | ICD-10-CM | POA: Insufficient documentation

## 2012-07-04 DIAGNOSIS — F17200 Nicotine dependence, unspecified, uncomplicated: Secondary | ICD-10-CM | POA: Insufficient documentation

## 2012-07-04 DIAGNOSIS — I2510 Atherosclerotic heart disease of native coronary artery without angina pectoris: Secondary | ICD-10-CM | POA: Insufficient documentation

## 2012-07-04 DIAGNOSIS — R0602 Shortness of breath: Secondary | ICD-10-CM | POA: Insufficient documentation

## 2012-07-04 DIAGNOSIS — S298XXA Other specified injuries of thorax, initial encounter: Secondary | ICD-10-CM | POA: Insufficient documentation

## 2012-07-04 DIAGNOSIS — W1809XA Striking against other object with subsequent fall, initial encounter: Secondary | ICD-10-CM | POA: Insufficient documentation

## 2012-07-04 DIAGNOSIS — E119 Type 2 diabetes mellitus without complications: Secondary | ICD-10-CM | POA: Insufficient documentation

## 2012-07-04 NOTE — ED Notes (Signed)
Pt fell last nite on hit left rib area on the curb.  Pt states hurts to take deep breath

## 2012-07-05 ENCOUNTER — Encounter (HOSPITAL_COMMUNITY): Payer: Self-pay | Admitting: Nurse Practitioner

## 2012-07-05 ENCOUNTER — Emergency Department (HOSPITAL_COMMUNITY)
Admission: EM | Admit: 2012-07-05 | Discharge: 2012-07-05 | Disposition: A | Discharge status: Home / Self Care | Payer: Medicare Other | Attending: Emergency Medicine | Admitting: Emergency Medicine

## 2012-07-05 DIAGNOSIS — E119 Type 2 diabetes mellitus without complications: Secondary | ICD-10-CM | POA: Insufficient documentation

## 2012-07-05 DIAGNOSIS — Y939 Activity, unspecified: Secondary | ICD-10-CM | POA: Insufficient documentation

## 2012-07-05 DIAGNOSIS — S20212A Contusion of left front wall of thorax, initial encounter: Secondary | ICD-10-CM

## 2012-07-05 DIAGNOSIS — I2510 Atherosclerotic heart disease of native coronary artery without angina pectoris: Secondary | ICD-10-CM | POA: Insufficient documentation

## 2012-07-05 DIAGNOSIS — S20219A Contusion of unspecified front wall of thorax, initial encounter: Secondary | ICD-10-CM | POA: Insufficient documentation

## 2012-07-05 DIAGNOSIS — Y9289 Other specified places as the place of occurrence of the external cause: Secondary | ICD-10-CM | POA: Insufficient documentation

## 2012-07-05 DIAGNOSIS — F17200 Nicotine dependence, unspecified, uncomplicated: Secondary | ICD-10-CM | POA: Insufficient documentation

## 2012-07-05 DIAGNOSIS — Z79899 Other long term (current) drug therapy: Secondary | ICD-10-CM | POA: Insufficient documentation

## 2012-07-05 DIAGNOSIS — W101XXA Fall (on)(from) sidewalk curb, initial encounter: Secondary | ICD-10-CM | POA: Insufficient documentation

## 2012-07-05 DIAGNOSIS — I10 Essential (primary) hypertension: Secondary | ICD-10-CM | POA: Insufficient documentation

## 2012-07-05 MED ORDER — OXYCODONE-ACETAMINOPHEN 5-325 MG PO TABS: 1.0000 | ORAL_TABLET | ORAL | Status: DC | PRN

## 2012-07-05 MED ORDER — IBUPROFEN 800 MG PO TABS: 800.0000 mg | ORAL_TABLET | Freq: Three times a day (TID) | ORAL | Status: DC

## 2012-07-05 NOTE — ED Notes (Signed)
States he fell off the curb Sunday and having upper body pain since. Was seen here yesterday and xrays were done but pt could not wait for results and left before he saw the doctor. Ambulatory, mae

## 2012-07-05 NOTE — ED Provider Notes (Signed)
History    This chart was scribed for non-physician practitioner Elpidio Anis, PA-C working with Celene Kras, MD by Gerlean Ren, ED Scribe. This patient was seen in room TR07C/TR07C and the patient's care was started at 4:42 PM.   CSN: [id number]  Arrival date & time 07/05/12  1357   First MD Initiated Contact with Patient 07/05/12 1520      Chief Complaint  Patient presents with  . Fall     The history is provided by the patient. No language interpreter was used.  Leonard Stone is a 67 y.o. male who presents to the Emergency Department complaining of constant, aching left lateral rib pain with sudden osnet after falling yesterday morning making trauma to the area with the streetside curb.  Pain worsened by deep breaths.  Pt able to ambulate.  No further injuries as result of the fall.  Pt was seen here yesterday and had negative XR of left lateral ribs but states he left AMA and has returned.  Pt has h/o DM and takes oxycodone daily for peripheral neuropathy but states he cannot refill that prescription for next couple of weeks.  Oxycodone written by Dr. Clide Deutscher.   Past Medical History  Diagnosis Date  . Hypertension   . Coronary artery disease   . Diabetes mellitus     Past Surgical History  Procedure Laterality Date  . Appendectomy      History reviewed. No pertinent family history.  History  Substance Use Topics  . Smoking status: Current Every Day Smoker  . Smokeless tobacco: Not on file  . Alcohol Use: Yes     Comment: occ      Review of Systems  HENT: Negative for neck pain.   Respiratory: Negative for shortness of breath.   Gastrointestinal: Negative for abdominal pain.  Musculoskeletal: Negative for back pain.       Positive left chest wall tenderness    Allergies  Vicodin  Home Medications   Current Outpatient Rx  Name  Route  Sig  Dispense  Refill  . lisinopril (PRINIVIL,ZESTRIL) 20 MG tablet   Oral   Take 0.5 tablets (10 mg total) by mouth daily.   30 tablet   1   . metFORMIN (GLUCOPHAGE) 1000 MG tablet   Oral   Take 1 tablet (1,000 mg total) by mouth 2 (two) times daily with a meal.   60 tablet   1   . Oxycodone HCl 10 MG TABS   Oral   Take 10 mg by mouth every 6 (six) hours as needed (for pain).         . ranitidine (ZANTAC) 150 MG tablet   Oral   Take 150 mg by mouth daily.           BP 151/89  Pulse 78  Temp(Src) 98.4 F (36.9 C) (Oral)  Resp 16  SpO2 99%  Physical Exam  Nursing note and vitals reviewed. Constitutional: He is oriented to person, place, and time. He appears well-developed and well-nourished. No distress.  HENT:  Head: Normocephalic and atraumatic.  Eyes: EOM are normal.  Neck: Neck supple. No tracheal deviation present.  Cervical spine non-tender  Cardiovascular: Normal rate.   Pulmonary/Chest: Effort normal and breath sounds normal. No respiratory distress. He has no wheezes. He has no rales.  Chest wall appears atraumatic, tender in mid axillary wall and left chest wall  Abdominal: Soft. He exhibits no distension. There is no tenderness. There is no rebound and no guarding.  Musculoskeletal: Normal range of motion.  Neurological: He is alert and oriented to person, place, and time.  Skin: Skin is warm and dry.  Psychiatric: He has a normal mood and affect. His behavior is normal.    ED Course  Procedures (including critical care time) DIAGNOSTIC STUDIES: Oxygen Saturation is 99% on room air, normal by my interpretation.    COORDINATION OF CARE: 4:46 PM- Informed pt I can write short course Percocet but that pain management for peripheral neuropathy must be followed by Dr. Clide Deutscher.  Pt verbalizes understanding and agrees with plan.     Dg Ribs Unilateral W/chest Left  07/04/2012  *RADIOLOGY REPORT*  Clinical Data: Fall.  Left chest pain  LEFT RIBS AND CHEST - 3+ VIEW  Comparison: 06/28/2011  Findings: The lungs are clear.  Negative for infiltrate or effusion.  Pneumothorax.  Negative  for heart failure.  Negative for left rib fracture.  IMPRESSION: No acute abnormality.   Original Report Authenticated By: Janeece Riggers, M.D.      No diagnosis found.   1. Contusion chest wall 2. Fall from curb MDM  Negative x-ray - no visible chest wall trauma. Stable for discharge.      I personally performed the services described in this documentation, which was scribed in my presence. The recorded information has been reviewed and is accurate.     Arnoldo Hooker, PA-C 07/05/12 1653

## 2012-07-05 NOTE — ED Provider Notes (Signed)
Medical screening examination/treatment/procedure(s) were performed by non-physician practitioner and as supervising physician I was immediately available for consultation/collaboration.    Charon Smedberg R Lana Flaim, MD 07/05/12 2345 

## 2012-08-11 ENCOUNTER — Emergency Department (HOSPITAL_COMMUNITY): Payer: Medicare Other

## 2012-08-11 ENCOUNTER — Emergency Department (HOSPITAL_COMMUNITY)
Admission: EM | Admit: 2012-08-11 | Discharge: 2012-08-11 | Disposition: A | Discharge status: Home / Self Care | Payer: Medicare Other | Attending: Emergency Medicine | Admitting: Emergency Medicine

## 2012-08-11 ENCOUNTER — Encounter (HOSPITAL_COMMUNITY): Payer: Self-pay | Admitting: Emergency Medicine

## 2012-08-11 DIAGNOSIS — E119 Type 2 diabetes mellitus without complications: Secondary | ICD-10-CM | POA: Insufficient documentation

## 2012-08-11 DIAGNOSIS — S02609A Fracture of mandible, unspecified, initial encounter for closed fracture: Secondary | ICD-10-CM

## 2012-08-11 DIAGNOSIS — F17200 Nicotine dependence, unspecified, uncomplicated: Secondary | ICD-10-CM | POA: Insufficient documentation

## 2012-08-11 DIAGNOSIS — S298XXA Other specified injuries of thorax, initial encounter: Secondary | ICD-10-CM | POA: Insufficient documentation

## 2012-08-11 DIAGNOSIS — I2510 Atherosclerotic heart disease of native coronary artery without angina pectoris: Secondary | ICD-10-CM | POA: Insufficient documentation

## 2012-08-11 DIAGNOSIS — S02640A Fracture of ramus of mandible, unspecified side, initial encounter for closed fracture: Secondary | ICD-10-CM | POA: Insufficient documentation

## 2012-08-11 DIAGNOSIS — Z79899 Other long term (current) drug therapy: Secondary | ICD-10-CM | POA: Insufficient documentation

## 2012-08-11 DIAGNOSIS — I10 Essential (primary) hypertension: Secondary | ICD-10-CM | POA: Insufficient documentation

## 2012-08-11 LAB — POCT I-STAT, CHEM 8
BUN: 13 mg/dL (ref 6–23)
Creatinine, Ser: 1.3 mg/dL (ref 0.50–1.35)
Potassium: 3.8 mEq/L (ref 3.5–5.1)
Sodium: 139 mEq/L (ref 135–145)

## 2012-08-11 LAB — CBC WITH DIFFERENTIAL/PLATELET
Hemoglobin: 13.4 g/dL (ref 13.0–17.0)
Lymphocytes Relative: 23 % (ref 12–46)
Lymphs Abs: 1.6 10*3/uL (ref 0.7–4.0)
MCH: 31.5 pg (ref 26.0–34.0)
Monocytes Relative: 8 % (ref 3–12)
Neutro Abs: 4.5 10*3/uL (ref 1.7–7.7)
Neutrophils Relative %: 64 % (ref 43–77)
RBC: 4.26 MIL/uL (ref 4.22–5.81)

## 2012-08-11 MED ORDER — SODIUM CHLORIDE 0.9 % IV SOLN
Freq: Once | INTRAVENOUS | Status: AC
  Administered 2012-08-11: 08:00:00 via INTRAVENOUS

## 2012-08-11 MED ORDER — MORPHINE SULFATE 4 MG/ML IJ SOLN
4.0000 mg | Freq: Once | INTRAMUSCULAR | Status: AC
  Administered 2012-08-11: 4 mg via INTRAVENOUS
  Filled 2012-08-11: qty 1

## 2012-08-11 MED ORDER — CLINDAMYCIN HCL 150 MG PO CAPS: 300.0000 mg | ORAL_CAPSULE | Freq: Three times a day (TID) | ORAL | Status: DC

## 2012-08-11 MED ORDER — OXYCODONE-ACETAMINOPHEN 5-325 MG PO TABS: 1.0000 | ORAL_TABLET | ORAL | Status: DC | PRN
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
## 2012-08-11 NOTE — ED Notes (Signed)
Pt c/o jaw pain on bilateral sides and right sided rib pain.

## 2012-08-11 NOTE — ED Notes (Signed)
MD at bedside. (ENT consult)

## 2012-08-11 NOTE — ED Notes (Signed)
ENT consult at bedside for evaluation.
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
## 2012-08-11 NOTE — Discharge Instructions (Signed)
Please see Dr. Pollyann Kennedy in the office in the next 1-2 weeks.  You have broken bones in your jaw - please eat only soft foods.
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
## 2012-08-11 NOTE — ED Notes (Signed)
MD at bedside. 

## 2012-08-22 ENCOUNTER — Emergency Department (HOSPITAL_COMMUNITY)
Admission: EM | Admit: 2012-08-22 | Discharge: 2012-08-22 | Disposition: A | Discharge status: Home / Self Care | Payer: Medicare Other | Attending: Emergency Medicine | Admitting: Emergency Medicine

## 2012-08-22 ENCOUNTER — Encounter (HOSPITAL_COMMUNITY): Payer: Self-pay | Admitting: Emergency Medicine

## 2012-08-22 DIAGNOSIS — R6884 Jaw pain: Secondary | ICD-10-CM | POA: Insufficient documentation

## 2012-08-22 DIAGNOSIS — I2510 Atherosclerotic heart disease of native coronary artery without angina pectoris: Secondary | ICD-10-CM | POA: Insufficient documentation

## 2012-08-22 DIAGNOSIS — E119 Type 2 diabetes mellitus without complications: Secondary | ICD-10-CM | POA: Insufficient documentation

## 2012-08-22 DIAGNOSIS — Z79899 Other long term (current) drug therapy: Secondary | ICD-10-CM | POA: Insufficient documentation

## 2012-08-22 DIAGNOSIS — R071 Chest pain on breathing: Secondary | ICD-10-CM | POA: Insufficient documentation

## 2012-08-22 DIAGNOSIS — Z8781 Personal history of (healed) traumatic fracture: Secondary | ICD-10-CM | POA: Insufficient documentation

## 2012-08-22 DIAGNOSIS — I10 Essential (primary) hypertension: Secondary | ICD-10-CM | POA: Insufficient documentation

## 2012-08-22 DIAGNOSIS — G8911 Acute pain due to trauma: Secondary | ICD-10-CM | POA: Insufficient documentation

## 2012-08-22 DIAGNOSIS — F17200 Nicotine dependence, unspecified, uncomplicated: Secondary | ICD-10-CM | POA: Insufficient documentation

## 2012-08-22 MED ORDER — OXYCODONE-ACETAMINOPHEN 5-325 MG PO TABS
1.0000 | ORAL_TABLET | Freq: Once | ORAL | Status: AC
  Administered 2012-08-22: 1 via ORAL
  Filled 2012-08-22: qty 1

## 2012-08-22 MED ORDER — OXYCODONE-ACETAMINOPHEN 5-325 MG PO TABS: 1.0000 | ORAL_TABLET | ORAL | Status: DC | PRN
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
## 2013-08-27 ENCOUNTER — Emergency Department (HOSPITAL_COMMUNITY)
Admission: EM | Admit: 2013-08-27 | Discharge: 2013-08-27 | Disposition: A | Discharge status: Home / Self Care | Payer: Medicare Other | Attending: Emergency Medicine | Admitting: Emergency Medicine

## 2013-08-27 ENCOUNTER — Emergency Department (HOSPITAL_COMMUNITY): Payer: Medicare Other

## 2013-08-27 ENCOUNTER — Encounter (HOSPITAL_COMMUNITY): Payer: Self-pay | Admitting: Emergency Medicine

## 2013-08-27 DIAGNOSIS — R209 Unspecified disturbances of skin sensation: Secondary | ICD-10-CM | POA: Diagnosis not present

## 2013-08-27 DIAGNOSIS — S6000XA Contusion of unspecified finger without damage to nail, initial encounter: Secondary | ICD-10-CM | POA: Insufficient documentation

## 2013-08-27 DIAGNOSIS — S0990XA Unspecified injury of head, initial encounter: Secondary | ICD-10-CM | POA: Insufficient documentation

## 2013-08-27 DIAGNOSIS — I2510 Atherosclerotic heart disease of native coronary artery without angina pectoris: Secondary | ICD-10-CM | POA: Diagnosis not present

## 2013-08-27 DIAGNOSIS — I10 Essential (primary) hypertension: Secondary | ICD-10-CM | POA: Diagnosis not present

## 2013-08-27 DIAGNOSIS — S60229A Contusion of unspecified hand, initial encounter: Secondary | ICD-10-CM | POA: Insufficient documentation

## 2013-08-27 DIAGNOSIS — F17200 Nicotine dependence, unspecified, uncomplicated: Secondary | ICD-10-CM | POA: Diagnosis not present

## 2013-08-27 DIAGNOSIS — H05232 Hemorrhage of left orbit: Secondary | ICD-10-CM

## 2013-08-27 DIAGNOSIS — S0993XA Unspecified injury of face, initial encounter: Secondary | ICD-10-CM | POA: Diagnosis present

## 2013-08-27 DIAGNOSIS — R739 Hyperglycemia, unspecified: Secondary | ICD-10-CM

## 2013-08-27 DIAGNOSIS — E119 Type 2 diabetes mellitus without complications: Secondary | ICD-10-CM | POA: Insufficient documentation

## 2013-08-27 DIAGNOSIS — Z79899 Other long term (current) drug therapy: Secondary | ICD-10-CM | POA: Diagnosis not present

## 2013-08-27 DIAGNOSIS — S0510XA Contusion of eyeball and orbital tissues, unspecified eye, initial encounter: Secondary | ICD-10-CM | POA: Insufficient documentation

## 2013-08-27 LAB — CBC WITH DIFFERENTIAL/PLATELET
BASOS PCT: 0 % (ref 0–1)
Basophils Absolute: 0 10*3/uL (ref 0.0–0.1)
EOS ABS: 0.3 10*3/uL (ref 0.0–0.7)
EOS PCT: 3 % (ref 0–5)
HCT: 36.8 % — ABNORMAL LOW (ref 39.0–52.0)
Hemoglobin: 13.2 g/dL (ref 13.0–17.0)
LYMPHS ABS: 1.3 10*3/uL (ref 0.7–4.0)
Lymphocytes Relative: 14 % (ref 12–46)
MCH: 30.7 pg (ref 26.0–34.0)
MCHC: 35.9 g/dL (ref 30.0–36.0)
MCV: 85.6 fL (ref 78.0–100.0)
Monocytes Absolute: 0.6 10*3/uL (ref 0.1–1.0)
Monocytes Relative: 6 % (ref 3–12)
Neutro Abs: 7.3 10*3/uL (ref 1.7–7.7)
Neutrophils Relative %: 77 % (ref 43–77)
PLATELETS: 279 10*3/uL (ref 150–400)
RBC: 4.3 MIL/uL (ref 4.22–5.81)
RDW: 12 % (ref 11.5–15.5)
WBC: 9.5 10*3/uL (ref 4.0–10.5)

## 2013-08-27 LAB — COMPREHENSIVE METABOLIC PANEL
ALBUMIN: 3.6 g/dL (ref 3.5–5.2)
ALT: 9 U/L (ref 0–53)
AST: 17 U/L (ref 0–37)
Alkaline Phosphatase: 153 U/L — ABNORMAL HIGH (ref 39–117)
BUN: 16 mg/dL (ref 6–23)
CALCIUM: 9.4 mg/dL (ref 8.4–10.5)
CO2: 23 mEq/L (ref 19–32)
Chloride: 95 mEq/L — ABNORMAL LOW (ref 96–112)
Creatinine, Ser: 1.28 mg/dL (ref 0.50–1.35)
GFR calc non Af Amer: 57 mL/min — ABNORMAL LOW (ref >90)
GFR, EST AFRICAN AMERICAN: 66 mL/min — AB (ref >90)
GLUCOSE: 494 mg/dL — AB (ref 70–99)
Potassium: 3.9 mEq/L (ref 3.7–5.3)
SODIUM: 134 meq/L — AB (ref 137–147)
TOTAL PROTEIN: 7 g/dL (ref 6.0–8.3)
Total Bilirubin: 0.4 mg/dL (ref 0.3–1.2)

## 2013-08-27 LAB — PROTIME-INR
INR: 1 (ref 0.00–1.49)
PROTHROMBIN TIME: 13 s (ref 11.6–15.2)

## 2013-08-27 MED ORDER — OXYCODONE-ACETAMINOPHEN 10-325 MG PO TABS: 2.0000 | ORAL_TABLET | ORAL | Status: DC | PRN

## 2013-08-27 MED ORDER — METFORMIN HCL 1000 MG PO TABS: 1000.0000 mg | ORAL_TABLET | Freq: Two times a day (BID) | ORAL | Status: DC

## 2013-08-27 MED ORDER — OXYCODONE-ACETAMINOPHEN 5-325 MG PO TABS
2.0000 | ORAL_TABLET | Freq: Once | ORAL | Status: AC
  Administered 2013-08-27: 2 via ORAL
  Filled 2013-08-27: qty 2

## 2013-08-27 MED ORDER — METFORMIN HCL 500 MG PO TABS
500.0000 mg | ORAL_TABLET | Freq: Once | ORAL | Status: AC
  Administered 2013-08-27: 500 mg via ORAL
  Filled 2013-08-27: qty 1
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
## 2013-08-27 NOTE — ED Notes (Signed)
Ultrasound at bedside
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
## 2013-09-07 ENCOUNTER — Telehealth: Payer: Self-pay | Admitting: General Practice

## 2013-09-07 NOTE — Telephone Encounter (Signed)
Returned pt's call.  Both listed go straight to voicemail and unable to take anymore messages.  Pt also left a [phone] number which is a phone number to a business.

## 2013-10-22 DIAGNOSIS — N179 Acute kidney failure, unspecified: Secondary | ICD-10-CM

## 2013-10-22 HISTORY — DX: Acute kidney failure, unspecified: N17.9

## 2014-06-29 ENCOUNTER — Emergency Department (HOSPITAL_COMMUNITY)
Admission: EM | Admit: 2014-06-29 | Discharge: 2014-06-29 | Disposition: A | Discharge status: Home / Self Care | Payer: Medicare Other | Attending: Emergency Medicine | Admitting: Emergency Medicine

## 2014-06-29 ENCOUNTER — Emergency Department (HOSPITAL_COMMUNITY): Payer: Medicare Other

## 2014-06-29 ENCOUNTER — Encounter (HOSPITAL_COMMUNITY): Payer: Self-pay | Admitting: Emergency Medicine

## 2014-06-29 DIAGNOSIS — Z720 Tobacco use: Secondary | ICD-10-CM | POA: Diagnosis not present

## 2014-06-29 DIAGNOSIS — R52 Pain, unspecified: Secondary | ICD-10-CM

## 2014-06-29 DIAGNOSIS — I10 Essential (primary) hypertension: Secondary | ICD-10-CM | POA: Insufficient documentation

## 2014-06-29 DIAGNOSIS — Z79899 Other long term (current) drug therapy: Secondary | ICD-10-CM | POA: Insufficient documentation

## 2014-06-29 DIAGNOSIS — E1165 Type 2 diabetes mellitus with hyperglycemia: Secondary | ICD-10-CM | POA: Diagnosis not present

## 2014-06-29 DIAGNOSIS — I2510 Atherosclerotic heart disease of native coronary artery without angina pectoris: Secondary | ICD-10-CM | POA: Diagnosis not present

## 2014-06-29 DIAGNOSIS — R7989 Other specified abnormal findings of blood chemistry: Secondary | ICD-10-CM | POA: Diagnosis not present

## 2014-06-29 DIAGNOSIS — G629 Polyneuropathy, unspecified: Secondary | ICD-10-CM | POA: Diagnosis not present

## 2014-06-29 DIAGNOSIS — R739 Hyperglycemia, unspecified: Secondary | ICD-10-CM

## 2014-06-29 DIAGNOSIS — R200 Anesthesia of skin: Secondary | ICD-10-CM | POA: Diagnosis present

## 2014-06-29 LAB — I-STAT CHEM 8, ED
BUN: 25 mg/dL — ABNORMAL HIGH (ref 6–23)
CALCIUM ION: 1.07 mmol/L — AB (ref 1.13–1.30)
Chloride: 99 mmol/L (ref 96–112)
Creatinine, Ser: 1.4 mg/dL — ABNORMAL HIGH (ref 0.50–1.35)
Glucose, Bld: 312 mg/dL — ABNORMAL HIGH (ref 70–99)
HCT: 37 % — ABNORMAL LOW (ref 39.0–52.0)
Hemoglobin: 12.6 g/dL — ABNORMAL LOW (ref 13.0–17.0)
Potassium: 4.9 mmol/L (ref 3.5–5.1)
SODIUM: 134 mmol/L — AB (ref 135–145)
TCO2: 24 mmol/L (ref 0–100)

## 2014-06-29 LAB — URINALYSIS, ROUTINE W REFLEX MICROSCOPIC
Bilirubin Urine: NEGATIVE
Hgb urine dipstick: NEGATIVE
KETONES UR: NEGATIVE mg/dL
LEUKOCYTES UA: NEGATIVE
Nitrite: NEGATIVE
PROTEIN: NEGATIVE mg/dL
Specific Gravity, Urine: 1.016 (ref 1.005–1.030)
Urobilinogen, UA: 0.2 mg/dL (ref 0.0–1.0)
pH: 6.5 (ref 5.0–8.0)

## 2014-06-29 LAB — CBC WITH DIFFERENTIAL/PLATELET
BASOS PCT: 1 % (ref 0–1)
Basophils Absolute: 0 10*3/uL (ref 0.0–0.1)
Eosinophils Absolute: 0.4 10*3/uL (ref 0.0–0.7)
Eosinophils Relative: 6 % — ABNORMAL HIGH (ref 0–5)
HCT: 35.2 % — ABNORMAL LOW (ref 39.0–52.0)
Hemoglobin: 11.9 g/dL — ABNORMAL LOW (ref 13.0–17.0)
LYMPHS PCT: 26 % (ref 12–46)
Lymphs Abs: 1.6 10*3/uL (ref 0.7–4.0)
MCH: 29.3 pg (ref 26.0–34.0)
MCHC: 33.8 g/dL (ref 30.0–36.0)
MCV: 86.7 fL (ref 78.0–100.0)
Monocytes Absolute: 0.4 10*3/uL (ref 0.1–1.0)
Monocytes Relative: 7 % (ref 3–12)
NEUTROS PCT: 60 % (ref 43–77)
Neutro Abs: 3.6 10*3/uL (ref 1.7–7.7)
Platelets: 246 10*3/uL (ref 150–400)
RBC: 4.06 MIL/uL — AB (ref 4.22–5.81)
RDW: 12.7 % (ref 11.5–15.5)
WBC: 5.9 10*3/uL (ref 4.0–10.5)

## 2014-06-29 LAB — TROPONIN I: Troponin I: <0.03 ng/mL (ref <0.031)

## 2014-06-29 LAB — URINE MICROSCOPIC-ADD ON

## 2014-06-29 LAB — CBG MONITORING, ED: GLUCOSE-CAPILLARY: 333 mg/dL — AB (ref 70–99)

## 2014-06-29 MED ORDER — GABAPENTIN 100 MG PO CAPS: 100.0000 mg | ORAL_CAPSULE | Freq: Three times a day (TID) | ORAL | Status: AC

## 2014-06-29 MED ORDER — OXYCODONE-ACETAMINOPHEN 5-325 MG PO TABS: ORAL_TABLET | ORAL | Status: DC

## 2014-06-29 MED ORDER — SODIUM CHLORIDE 0.9 % IV BOLUS (SEPSIS)
1000.0000 mL | Freq: Once | INTRAVENOUS | Status: AC
  Administered 2014-06-29: 1000 mL via INTRAVENOUS

## 2014-06-29 MED ORDER — GABAPENTIN 100 MG PO CAPS: 200.0000 mg | ORAL_CAPSULE | Freq: Three times a day (TID) | ORAL | Status: DC

## 2014-06-29 NOTE — Discharge Instructions (Signed)
Take percocet/gabapentin for breakthrough pain, do not drink alcohol, drive, care for children or do other critical tasks while taking percocet/gabapentin.  Your creatinine (which is a level of your kidney function) is elevated, he will need to drink plenty of water and check in with your primary care doctor for recheck next week  Please follow with your primary care doctor in the next 2 days for a check-up. They must obtain records for further management.   Do not hesitate to return to the Emergency Department for any new, worsening or concerning symptoms.

## 2014-06-29 NOTE — ED Notes (Signed)
Pt here for evaluation of tingling, stabbing, and cold sensations occurring in bilateral hands and feet. Pt has hx of diabetes. Pulses present strong, bilaterally in hands and feet. Pt alert and oriented. Pt states he has had these sensations for a while, however they are getting "unbearable." Pt alert and oriented. VSS.

## 2014-06-29 NOTE — ED Provider Notes (Signed)
CSN: [id number]     Arrival date & time 06/29/14  1009 History   First MD Initiated Contact with Patient 06/29/14 1024     Chief Complaint  Patient presents with  . Numbness     (Consider location/radiation/quality/duration/timing/severity/associated sxs/prior Treatment) HPI   Leonard Stone is a 69 y.o. male complaining of increasing tingling, stabbing pain to bilateral hands and feet, he's had this for years but gotten significantly worse over the last 2 weeks. Patient is taking gabapentin 100 mg 3 times a day with little relief, he stopped taking it about a week ago because it was not helping. Patient has not been taking his insulin for approximately 10 days because he ran out of needles, states that Medicaid won't authorize a refill until next month. He rates his pain is severe, 10 out of 10 in its keeping him him from sleeping at night. No exacerbating or alleviating factors identified. He states that he's had a little bit of polyuria and polydipsia lately, no nausea, vomiting, shortness of breath. Patient states that he has intermittent chest pain which is not atypical for his baseline which she's had for greater than a year. He denies increasing peripheral edema, cough, fever, chills, headache, dysarthria, ataxia, change in vision, cervicalgia.   PCP [place]  Past Medical History  Diagnosis Date  . Hypertension   . Coronary artery disease   . Diabetes mellitus    Past Surgical History  Procedure Laterality Date  . Appendectomy     History reviewed. No pertinent family history. History  Substance Use Topics  . Smoking status: Current Every Day Smoker -- 0.25 packs/day    Types: Cigarettes  . Smokeless tobacco: Not on file  . Alcohol Use: Yes     Comment: occ    Review of Systems  10 systems reviewed and found to be negative, except as noted in the HPI.   Allergies  Vicodin  Home Medications   Prior to Admission medications   Medication Sig Start Date End Date  Taking? Authorizing Provider  lisinopril-hydrochlorothiazide (PRINZIDE,ZESTORETIC) 20-12.5 MG per tablet Take 1 tablet by mouth daily.   Yes Historical Provider, MD  metFORMIN (GLUCOPHAGE) 1000 MG tablet Take 1 tablet (1,000 mg total) by mouth 2 (two) times daily with a meal. 08/27/13  Yes Linton Flemings, MD  gabapentin (NEURONTIN) 100 MG capsule Take 2 capsules (200 mg total) by mouth 3 (three) times daily. 06/29/14   Chauncey Sciulli, PA-C  gabapentin (NEURONTIN) 100 MG capsule Take 1 capsule (100 mg total) by mouth 3 (three) times daily. 06/29/14   Chou Busler, PA-C  oxyCODONE-acetaminophen (PERCOCET/ROXICET) 5-325 MG per tablet 1 to 2 tabs PO q6hrs  PRN for pain 06/29/14   Elmyra Ricks Lu Paradise, PA-C   BP 145/99 mmHg  Pulse 86  Temp(Src) 98.3 F (36.8 C) (Oral)  Resp 13  SpO2 100% Physical Exam  Constitutional: He is oriented to person, place, and time. He appears well-developed and well-nourished. No distress.  HENT:  Head: Normocephalic and atraumatic.  Mouth/Throat: Oropharynx is clear and moist.  Eyes: Conjunctivae and EOM are normal. Pupils are equal, round, and reactive to light.  Neck: Normal range of motion.  Cardiovascular: Normal rate, regular rhythm and intact distal pulses.   Pulmonary/Chest: Effort normal and breath sounds normal.  Abdominal: Soft. There is no tenderness.  Musculoskeletal: Normal range of motion.  Neurological: He is alert and oriented to person, place, and time. No cranial nerve deficit.  II-Visual fields grossly intact. III/IV/VI-Extraocular movements intact.  Pupils  reactive bilaterally. V/VII-Smile symmetric, equal eyebrow raise,  facial sensation intact VIII- Hearing grossly intact IX/X-Normal gag XI-bilateral shoulder shrug XII-midline tongue extension Motor: 5/5 bilaterally with normal tone and bulk Cerebellar: Normal finger-to-nose  and normal heel-to-shin test.   Romberg negative Ambulates with a coordinated gait   Skin: He is not diaphoretic.   Psychiatric: He has a normal mood and affect.  Nursing note and vitals reviewed.   ED Course  Procedures (including critical care time) Labs Review Labs Reviewed  URINALYSIS, ROUTINE W REFLEX MICROSCOPIC - Abnormal; Notable for the following:    Glucose, UA >1000 (*)    All other components within normal limits  CBC WITH DIFFERENTIAL/PLATELET - Abnormal; Notable for the following:    RBC 4.06 (*)    Hemoglobin 11.9 (*)    HCT 35.2 (*)    Eosinophils Relative 6 (*)    All other components within normal limits  CBG MONITORING, ED - Abnormal; Notable for the following:    Glucose-Capillary 333 (*)    All other components within normal limits  I-STAT CHEM 8, ED - Abnormal; Notable for the following:    Sodium 134 (*)    BUN 25 (*)    Creatinine, Ser 1.40 (*)    Glucose, Bld 312 (*)    Calcium, Ion 1.07 (*)    Hemoglobin 12.6 (*)    HCT 37.0 (*)    All other components within normal limits  TROPONIN I  URINE MICROSCOPIC-ADD ON    Imaging Review Dg Chest 2 View  06/29/2014   CLINICAL DATA:  Left-sided chest pain x1 month  EXAM: CHEST  2 VIEW  COMPARISON:  08/11/2012  FINDINGS: Lungs are clear.  No pleural effusion or pneumothorax.  The heart is normal in size.  Visualized osseous structures are within normal limits.  IMPRESSION: Normal chest radiographs.   Electronically Signed   By: Julian Hy M.D.   On: 06/29/2014 12:23     EKG Interpretation   Date/Time:  Friday June 29 2014 11:19:32 EDT Ventricular Rate:  77 PR Interval:  168 QRS Duration: 88 QT Interval:  381 QTC Calculation: 431 R Axis:   74 Text Interpretation:  Sinus rhythm Normal ECG When compared with ECG of  06/28/2011 No significant change was found Confirmed by [facility]  MD,  KATHLEEN ([id number]) on 06/29/2014 11:30:38 AM      MDM   Final diagnoses:  Pain  Peripheral neuropathy  Hyperglycemia without ketosis  Elevated serum creatinine    Filed Vitals:   06/29/14 1025 06/29/14 1100  BP:  154/82 145/99  Pulse: 83 86  Temp: 98.3 F (36.8 C)   TempSrc: Oral   Resp: 18 13  SpO2: 100% 100%    Medications  sodium chloride 0.9 % bolus 1,000 mL (0 mLs Intravenous Stopped 06/29/14 1222)    Leonard Stone is a pleasant 70 y.o. male presenting with increasing pain 4 extremities which she describes as numbness. Likely diabetic neuropathy. Patient's neuro exam is nonfocal, doubt this is CVA. Patient has not had his insulin in about 10 days because he doesn't have the needles for his dispenser. I've called the pharmacy ([facility] [address]) and they can fill this along the patient has a prescription. They state he has no prescription. I will hand write him a prescription for the needles. Patient will follow with his primary care doctor the first of next month.  CBG elevated at >300 will check bloodwork and UA to r/o DKA, patient is experiencing  chest pain which she's had intermittently for greater than a year. This is nonexertional. Doubt this is ACS however I don't other this is been evaluated in the past and will get EKG, troponin and chest x-ray.  EKG with no acute changes, troponin is negative. Patient has elevated glucose with normal anion gap and no ketones in his urine. Patient's creatinine is bumped to 1.4, this is higher than his norm however he does have chronic kidney disease. Patient will be given fluid bolus in the ED, I've advised him that he will need to have this rechecked with his primary care doctor.  Evaluation does not show pathology that would require ongoing emergent intervention or inpatient treatment. Pt is hemodynamically stable and mentating appropriately. Discussed findings and plan with patient/guardian, who agrees with care plan. All questions answered. Return precautions discussed and outpatient follow up given.   New Prescriptions   GABAPENTIN (NEURONTIN) 100 MG CAPSULE    Take 2 capsules (200 mg total) by mouth 3 (three) times daily.    OXYCODONE-ACETAMINOPHEN (PERCOCET/ROXICET) 5-325 MG PER TABLET    1 to 2 tabs PO q6hrs  PRN for pain         Monico Blitz, PA-C 06/29/14 [place], DO 06/30/14 [phone]

## 2014-09-14 DIAGNOSIS — H25019 Cortical age-related cataract, unspecified eye: Secondary | ICD-10-CM | POA: Diagnosis not present

## 2014-09-14 DIAGNOSIS — H538 Other visual disturbances: Secondary | ICD-10-CM | POA: Diagnosis not present

## 2014-09-14 DIAGNOSIS — E139 Other specified diabetes mellitus without complications: Secondary | ICD-10-CM | POA: Diagnosis not present

## 2014-09-22 ENCOUNTER — Encounter (HOSPITAL_COMMUNITY): Payer: Self-pay | Admitting: Emergency Medicine

## 2014-09-22 ENCOUNTER — Emergency Department (HOSPITAL_COMMUNITY): Payer: Medicare Other

## 2014-09-22 ENCOUNTER — Emergency Department (HOSPITAL_COMMUNITY)
Admission: EM | Admit: 2014-09-22 | Discharge: 2014-09-22 | Disposition: A | Discharge status: Home / Self Care | Payer: Medicare Other | Attending: Emergency Medicine | Admitting: Emergency Medicine

## 2014-09-22 DIAGNOSIS — R7989 Other specified abnormal findings of blood chemistry: Secondary | ICD-10-CM | POA: Diagnosis not present

## 2014-09-22 DIAGNOSIS — Z720 Tobacco use: Secondary | ICD-10-CM | POA: Insufficient documentation

## 2014-09-22 DIAGNOSIS — Z79899 Other long term (current) drug therapy: Secondary | ICD-10-CM | POA: Diagnosis not present

## 2014-09-22 DIAGNOSIS — E119 Type 2 diabetes mellitus without complications: Secondary | ICD-10-CM | POA: Insufficient documentation

## 2014-09-22 DIAGNOSIS — N410 Acute prostatitis: Secondary | ICD-10-CM | POA: Diagnosis not present

## 2014-09-22 DIAGNOSIS — R109 Unspecified abdominal pain: Secondary | ICD-10-CM | POA: Diagnosis present

## 2014-09-22 DIAGNOSIS — I10 Essential (primary) hypertension: Secondary | ICD-10-CM | POA: Insufficient documentation

## 2014-09-22 DIAGNOSIS — N400 Enlarged prostate without lower urinary tract symptoms: Secondary | ICD-10-CM | POA: Diagnosis not present

## 2014-09-22 DIAGNOSIS — N281 Cyst of kidney, acquired: Secondary | ICD-10-CM | POA: Diagnosis not present

## 2014-09-22 DIAGNOSIS — I2510 Atherosclerotic heart disease of native coronary artery without angina pectoris: Secondary | ICD-10-CM | POA: Insufficient documentation

## 2014-09-22 LAB — URINE MICROSCOPIC-ADD ON

## 2014-09-22 LAB — URINALYSIS, ROUTINE W REFLEX MICROSCOPIC
BILIRUBIN URINE: NEGATIVE
Glucose, UA: NEGATIVE mg/dL
HGB URINE DIPSTICK: NEGATIVE
KETONES UR: NEGATIVE mg/dL
Nitrite: NEGATIVE
PH: 5 (ref 5.0–8.0)
Protein, ur: NEGATIVE mg/dL
Specific Gravity, Urine: 1.016 (ref 1.005–1.030)
Urobilinogen, UA: 0.2 mg/dL (ref 0.0–1.0)

## 2014-09-22 LAB — COMPREHENSIVE METABOLIC PANEL
ALBUMIN: 3.8 g/dL (ref 3.5–5.0)
ALT: 12 U/L — AB (ref 17–63)
AST: 18 U/L (ref 15–41)
Alkaline Phosphatase: 48 U/L (ref 38–126)
Anion gap: 10 (ref 5–15)
BILIRUBIN TOTAL: 0.6 mg/dL (ref 0.3–1.2)
BUN: 22 mg/dL — ABNORMAL HIGH (ref 6–20)
CALCIUM: 9.5 mg/dL (ref 8.9–10.3)
CHLORIDE: 101 mmol/L (ref 101–111)
CO2: 23 mmol/L (ref 22–32)
Creatinine, Ser: 1.93 mg/dL — ABNORMAL HIGH (ref 0.61–1.24)
GFR calc Af Amer: 40 mL/min — ABNORMAL LOW (ref >60)
GFR, EST NON AFRICAN AMERICAN: 34 mL/min — AB (ref >60)
Glucose, Bld: 180 mg/dL — ABNORMAL HIGH (ref 65–99)
Potassium: 4.4 mmol/L (ref 3.5–5.1)
SODIUM: 134 mmol/L — AB (ref 135–145)
Total Protein: 6.8 g/dL (ref 6.5–8.1)

## 2014-09-22 LAB — CBC WITH DIFFERENTIAL/PLATELET
BASOS PCT: 1 % (ref 0–1)
Basophils Absolute: 0.1 10*3/uL (ref 0.0–0.1)
EOS ABS: 0.4 10*3/uL (ref 0.0–0.7)
Eosinophils Relative: 6 % — ABNORMAL HIGH (ref 0–5)
HEMATOCRIT: 34.3 % — AB (ref 39.0–52.0)
HEMOGLOBIN: 11.7 g/dL — AB (ref 13.0–17.0)
Lymphocytes Relative: 29 % (ref 12–46)
Lymphs Abs: 1.9 10*3/uL (ref 0.7–4.0)
MCH: 29.8 pg (ref 26.0–34.0)
MCHC: 34.1 g/dL (ref 30.0–36.0)
MCV: 87.3 fL (ref 78.0–100.0)
MONO ABS: 0.6 10*3/uL (ref 0.1–1.0)
MONOS PCT: 9 % (ref 3–12)
NEUTROS PCT: 55 % (ref 43–77)
Neutro Abs: 3.6 10*3/uL (ref 1.7–7.7)
Platelets: 252 10*3/uL (ref 150–400)
RBC: 3.93 MIL/uL — AB (ref 4.22–5.81)
RDW: 13 % (ref 11.5–15.5)
WBC: 6.5 10*3/uL (ref 4.0–10.5)

## 2014-09-22 LAB — LIPASE, BLOOD: Lipase: 53 U/L — ABNORMAL HIGH (ref 22–51)

## 2014-09-22 MED ORDER — IOHEXOL 300 MG/ML  SOLN
75.0000 mL | Freq: Once | INTRAMUSCULAR | Status: AC | PRN
  Administered 2014-09-22: 75 mL via INTRAVENOUS

## 2014-09-22 MED ORDER — IOHEXOL 300 MG/ML  SOLN
25.0000 mL | Freq: Once | INTRAMUSCULAR | Status: AC | PRN
  Administered 2014-09-22: 25 mL via ORAL

## 2014-09-22 MED ORDER — ONDANSETRON HCL 4 MG/2ML IJ SOLN
4.0000 mg | Freq: Once | INTRAMUSCULAR | Status: AC
  Administered 2014-09-22: 4 mg via INTRAVENOUS
  Filled 2014-09-22: qty 2

## 2014-09-22 MED ORDER — SODIUM CHLORIDE 0.9 % IV BOLUS (SEPSIS)
500.0000 mL | Freq: Once | INTRAVENOUS | Status: AC
  Administered 2014-09-22: 500 mL via INTRAVENOUS

## 2014-09-22 MED ORDER — MORPHINE SULFATE 4 MG/ML IJ SOLN
4.0000 mg | Freq: Once | INTRAMUSCULAR | Status: AC
  Administered 2014-09-22: 4 mg via INTRAVENOUS
  Filled 2014-09-22: qty 1

## 2014-09-22 MED ORDER — CIPROFLOXACIN HCL 500 MG PO TABS: 500.0000 mg | ORAL_TABLET | Freq: Two times a day (BID) | ORAL | Status: DC

## 2014-09-22 NOTE — ED Notes (Signed)
Pt has finished oral contrast. CT notified.

## 2014-09-22 NOTE — ED Provider Notes (Signed)
CSN: [id number]     Arrival date & time 09/22/14  5366 History   First MD Initiated Contact with Patient 09/22/14 [phone]     Chief Complaint  Patient presents with  . Abdominal Pain     (Consider location/radiation/quality/duration/timing/severity/associated sxs/prior Treatment) HPI Comments: Pt comes in with c/o left sided abdominal pain that radiates to this flank. No fever or dysuria. He states that he goes from constipation to diarrhea. He states that he hasn't had a bm in the last 6 days. No vomiting. Doesn't have history of similar complaint  The history is provided by the patient. No language interpreter was used.    Past Medical History  Diagnosis Date  . Hypertension   . Coronary artery disease   . Diabetes mellitus    Past Surgical History  Procedure Laterality Date  . Appendectomy     History reviewed. No pertinent family history. History  Substance Use Topics  . Smoking status: Current Every Day Smoker -- 0.25 packs/day    Types: Cigarettes  . Smokeless tobacco: Not on file  . Alcohol Use: Yes     Comment: occ    Review of Systems  All other systems reviewed and are negative.     Allergies  Vicodin  Home Medications   Prior to Admission medications   Medication Sig Start Date End Date Taking? Authorizing Provider  gabapentin (NEURONTIN) 100 MG capsule Take 1 capsule (100 mg total) by mouth 3 (three) times daily. 06/29/14  Yes Nicole Pisciotta, PA-C  gabapentin (NEURONTIN) 300 MG capsule Take 300 mg by mouth 3 (three) times daily.   Yes Historical Provider, MD  lisinopril-hydrochlorothiazide (PRINZIDE,ZESTORETIC) 20-12.5 MG per tablet Take 1 tablet by mouth daily.   Yes Historical Provider, MD  metFORMIN (GLUCOPHAGE) 1000 MG tablet Take 1 tablet (1,000 mg total) by mouth 2 (two) times daily with a meal. 08/27/13  Yes Linton Flemings, MD   BP 124/84 mmHg  Pulse 84  Temp(Src) 98.2 F (36.8 C) (Oral)  Resp 20  SpO2 100% Physical Exam  Constitutional: He is  oriented to person, place, and time. He appears well-developed and well-nourished.  Cardiovascular: Normal rate and regular rhythm.   Pulmonary/Chest: Effort normal and breath sounds normal.  Abdominal: Soft. Bowel sounds are normal. There is no tenderness. There is no CVA tenderness.  Musculoskeletal: Normal range of motion.  Neurological: He is alert and oriented to person, place, and time.  Skin: Skin is warm and dry.  Psychiatric: He has a normal mood and affect.  Nursing note and vitals reviewed.   ED Course  Procedures (including critical care time) Labs Review Labs Reviewed  COMPREHENSIVE METABOLIC PANEL - Abnormal; Notable for the following:    Sodium 134 (*)    Glucose, Bld 180 (*)    BUN 22 (*)    Creatinine, Ser 1.93 (*)    ALT 12 (*)    GFR calc non Af Amer 34 (*)    GFR calc Af Amer 40 (*)    All other components within normal limits  CBC WITH DIFFERENTIAL/PLATELET - Abnormal; Notable for the following:    RBC 3.93 (*)    Hemoglobin 11.7 (*)    HCT 34.3 (*)    Eosinophils Relative 6 (*)    All other components within normal limits  LIPASE, BLOOD - Abnormal; Notable for the following:    Lipase 53 (*)    All other components within normal limits  URINALYSIS, ROUTINE W REFLEX MICROSCOPIC (NOT AT [facility]) - Abnormal;  Notable for the following:    Leukocytes, UA SMALL (*)    All other components within normal limits  URINE CULTURE  URINE MICROSCOPIC-ADD ON    Imaging Review Ct Abdomen Pelvis W Contrast  09/22/2014   CLINICAL DATA:  left sided abdominal pain that radiates into left flank. Does report burning with urination.  Surgery; appendectomy  EXAM: CT ABDOMEN AND PELVIS WITH CONTRAST  TECHNIQUE: Multidetector CT imaging of the abdomen and pelvis was performed using the standard protocol following bolus administration of intravenous contrast.  CONTRAST:  61mL OMNIPAQUE IOHEXOL 300 MG/ML  SOLN  COMPARISON:  None.  FINDINGS: Minimal dependent atelectasis in the  visualized lung bases. Unremarkable liver, gallbladder, spleen, adrenal glands, pancreas. Small bilateral renal cysts. No hydronephrosis or nephrolithiasis. Portal vein patent. Mild aortoiliac plaque without aneurysm or stenosis. Stomach, small bowel, and colon are nondilated. Appendix not identified. No focal inflammatory process evident. Urinary bladder physiologically distended. Mild prostatic enlargement. Bilateral pelvic phleboliths. No ascites. No free air. No adenopathy. Degenerative disc disease L5-S1.  IMPRESSION: 1. No acute abdominal process. 2. Nonacute ancillary findings as above.   Electronically Signed   By: Lucrezia Europe M.D.   On: 09/22/2014 12:22     EKG Interpretation None      MDM   Final diagnoses:  Prostatitis, acute  Elevated serum creatinine    Pt to go home with cipro for prostatitis.. Discussed recheck for kidney function with his pcp. He has an appoint on 7/16. No acute findings noted on ct    Glendell Docker, NP 09/22/14 [place], MD 09/23/14 [phone]

## 2014-09-22 NOTE — ED Notes (Signed)
Pt transported to CT in NAD. Pain free.

## 2014-09-22 NOTE — ED Notes (Signed)
Pt ambulatory at discharge. NAD on departure. Gait steady. A/O x4. Verbalizes understanding of needing to follow up with PCP about creatinine level and understands need to finish all antibiotic. VSS.

## 2014-09-22 NOTE — ED Notes (Signed)
Per pt - pt has not had a "normal" bowel movement in 14 days. Reports left sided abdominal pain that radiates into left flank. Does report burning with urination. Non tender on palpation, no distention noted. Pt is a/o x4.

## 2014-09-22 NOTE — ED Notes (Signed)
Pt returned from CT. Placed back on monitor. Remains pain free. NAD. No complaints.

## 2014-09-22 NOTE — Discharge Instructions (Signed)
You need to have your kidney function rechecked in the next 2 weeks Prostatitis The prostate gland is about the size and shape of a walnut. It is located just below your bladder. It produces one of the components of semen, which is made up of sperm and the fluids that help nourish and transport it out from the testicles. Prostatitis is inflammation of the prostate gland.  There are four types of prostatitis:  Acute bacterial prostatitis. This is the least common type of prostatitis. It starts quickly and usually is associated with a bladder infection, high fever, and shaking chills. It can occur at any age.  Chronic bacterial prostatitis. This is a persistent bacterial infection in the prostate. It usually develops from repeated acute bacterial prostatitis or acute bacterial prostatitis that was not properly treated. It can occur in men of any age but is most common in middle-aged men whose prostate has begun to enlarge. The symptoms are not as severe as those in acute bacterial prostatitis. Discomfort in the part of your body that is in front of your rectum and below your scrotum (perineum), lower abdomen, or in the head of your penis (glans) may represent your primary discomfort.  Chronic prostatitis (nonbacterial). This is the most common type of prostatitis. It is inflammation of the prostate gland that is not caused by a bacterial infection. The cause is unknown and may be associated with a viral infection or autoimmune disorder.  Prostatodynia (pelvic floor disorder). This is associated with increased muscular tone in the pelvis surrounding the prostate. CAUSES The causes of bacterial prostatitis are bacterial infection. The causes of the other types of prostatitis are unknown.  SYMPTOMS  Symptoms can vary depending upon the type of prostatitis that exists. There can also be overlap in symptoms. Possible symptoms for each type of prostatitis are listed below. Acute Bacterial  Prostatitis  Painful urination.  Fever or chills.  Muscle or joint pains.  Low back pain.  Low abdominal pain.  Inability to empty bladder completely. Chronic Bacterial Prostatitis, Chronic Nonbacterial Prostatitis, and Prostatodynia  Sudden urge to urinate.  Frequent urination.  Difficulty starting urine stream.  Weak urine stream.  Discharge from the urethra.  Dribbling after urination.  Rectal pain.  Pain in the testicles, penis, or tip of the penis.  Pain in the perineum.  Problems with sexual function.  Painful ejaculation.  Bloody semen. DIAGNOSIS  In order to diagnose prostatitis, your health care provider will ask about your symptoms. One or more urine samples will be taken and tested (urinalysis). If the urinalysis result is negative for bacteria, your health care provider may use a finger to feel your prostate (digital rectal exam). This exam helps your health care provider determine if your prostate is swollen and tender. It will also produce a specimen of semen that can be analyzed. TREATMENT  Treatment for prostatitis depends on the cause. If a bacterial infection is the cause, it can be treated with antibiotic medicine. In cases of chronic bacterial prostatitis, the use of antibiotics for up to 1 month or 6 weeks may be necessary. Your health care provider may instruct you to take sitz baths to help relieve pain. A sitz bath is a bath of hot water in which your hips and buttocks are under water. This relaxes the pelvic floor muscles and often helps to relieve the pressure on your prostate. HOME CARE INSTRUCTIONS   Take all medicines as directed by your health care provider.  Take sitz baths as directed  by your health care provider. SEEK MEDICAL CARE IF:   Your symptoms get worse, not better.  You have a fever. SEEK IMMEDIATE MEDICAL CARE IF:   You have chills.  You feel nauseous or vomit.  You feel lightheaded or faint.  You are unable to  urinate.  You have blood or blood clots in your urine. MAKE SURE YOU:  Understand these instructions.  Will watch your condition.  Will get help right away if you are not doing well or get worse. Document Released: 02/28/2000 Document Revised: 03/07/2013 Document Reviewed: 09/19/2012 [facility] Patient Information 2015 [place], [place]. This information is not intended to replace advice given to you by your health care provider. Make sure you discuss any questions you have with your health care provider.

## 2014-09-23 LAB — URINE CULTURE: Culture: NO GROWTH

## 2014-09-28 DIAGNOSIS — N183 Chronic kidney disease, stage 3 (moderate): Secondary | ICD-10-CM | POA: Diagnosis not present

## 2014-09-28 DIAGNOSIS — E119 Type 2 diabetes mellitus without complications: Secondary | ICD-10-CM | POA: Diagnosis not present

## 2014-09-28 DIAGNOSIS — G589 Mononeuropathy, unspecified: Secondary | ICD-10-CM | POA: Diagnosis not present

## 2014-09-28 DIAGNOSIS — I10 Essential (primary) hypertension: Secondary | ICD-10-CM | POA: Diagnosis not present

## 2014-10-23 ENCOUNTER — Encounter (HOSPITAL_COMMUNITY): Payer: Self-pay

## 2014-10-23 ENCOUNTER — Inpatient Hospital Stay (HOSPITAL_COMMUNITY)
Admission: EM | Admit: 2014-10-23 | Discharge: 2014-10-25 | DRG: 683 | Disposition: A | Discharge status: Home / Self Care | Payer: Medicare Other | Attending: Internal Medicine | Admitting: Internal Medicine

## 2014-10-23 ENCOUNTER — Emergency Department (HOSPITAL_COMMUNITY): Payer: Medicare Other

## 2014-10-23 DIAGNOSIS — R109 Unspecified abdominal pain: Secondary | ICD-10-CM | POA: Diagnosis not present

## 2014-10-23 DIAGNOSIS — N179 Acute kidney failure, unspecified: Secondary | ICD-10-CM | POA: Diagnosis not present

## 2014-10-23 DIAGNOSIS — E119 Type 2 diabetes mellitus without complications: Secondary | ICD-10-CM

## 2014-10-23 DIAGNOSIS — R3914 Feeling of incomplete bladder emptying: Secondary | ICD-10-CM | POA: Diagnosis present

## 2014-10-23 DIAGNOSIS — N410 Acute prostatitis: Secondary | ICD-10-CM | POA: Insufficient documentation

## 2014-10-23 DIAGNOSIS — N401 Enlarged prostate with lower urinary tract symptoms: Secondary | ICD-10-CM | POA: Diagnosis present

## 2014-10-23 DIAGNOSIS — E1140 Type 2 diabetes mellitus with diabetic neuropathy, unspecified: Secondary | ICD-10-CM | POA: Diagnosis not present

## 2014-10-23 DIAGNOSIS — Z79899 Other long term (current) drug therapy: Secondary | ICD-10-CM

## 2014-10-23 DIAGNOSIS — I10 Essential (primary) hypertension: Secondary | ICD-10-CM | POA: Diagnosis not present

## 2014-10-23 DIAGNOSIS — N419 Inflammatory disease of prostate, unspecified: Secondary | ICD-10-CM | POA: Diagnosis not present

## 2014-10-23 DIAGNOSIS — R740 Nonspecific elevation of levels of transaminase and lactic acid dehydrogenase [LDH]: Secondary | ICD-10-CM

## 2014-10-23 DIAGNOSIS — I129 Hypertensive chronic kidney disease with stage 1 through stage 4 chronic kidney disease, or unspecified chronic kidney disease: Secondary | ICD-10-CM | POA: Diagnosis present

## 2014-10-23 DIAGNOSIS — N289 Disorder of kidney and ureter, unspecified: Secondary | ICD-10-CM

## 2014-10-23 DIAGNOSIS — R351 Nocturia: Secondary | ICD-10-CM | POA: Diagnosis not present

## 2014-10-23 DIAGNOSIS — E1165 Type 2 diabetes mellitus with hyperglycemia: Secondary | ICD-10-CM | POA: Diagnosis present

## 2014-10-23 DIAGNOSIS — D1803 Hemangioma of intra-abdominal structures: Secondary | ICD-10-CM | POA: Diagnosis not present

## 2014-10-23 DIAGNOSIS — E860 Dehydration: Secondary | ICD-10-CM | POA: Diagnosis not present

## 2014-10-23 DIAGNOSIS — N183 Chronic kidney disease, stage 3 (moderate): Secondary | ICD-10-CM | POA: Diagnosis present

## 2014-10-23 DIAGNOSIS — I2510 Atherosclerotic heart disease of native coronary artery without angina pectoris: Secondary | ICD-10-CM | POA: Diagnosis present

## 2014-10-23 DIAGNOSIS — E1122 Type 2 diabetes mellitus with diabetic chronic kidney disease: Secondary | ICD-10-CM | POA: Diagnosis not present

## 2014-10-23 DIAGNOSIS — Z885 Allergy status to narcotic agent status: Secondary | ICD-10-CM

## 2014-10-23 DIAGNOSIS — E872 Acidosis: Secondary | ICD-10-CM | POA: Diagnosis present

## 2014-10-23 DIAGNOSIS — F17210 Nicotine dependence, cigarettes, uncomplicated: Secondary | ICD-10-CM | POA: Diagnosis not present

## 2014-10-23 DIAGNOSIS — R1030 Lower abdominal pain, unspecified: Secondary | ICD-10-CM | POA: Diagnosis not present

## 2014-10-23 DIAGNOSIS — A419 Sepsis, unspecified organism: Secondary | ICD-10-CM

## 2014-10-23 DIAGNOSIS — R7402 Elevation of levels of lactic acid dehydrogenase (LDH): Secondary | ICD-10-CM

## 2014-10-23 HISTORY — DX: Personal history of other diseases of the digestive system: Z87.19

## 2014-10-23 HISTORY — DX: Gastro-esophageal reflux disease without esophagitis: K21.9

## 2014-10-23 HISTORY — DX: Type 2 diabetes mellitus without complications: E11.9

## 2014-10-23 HISTORY — DX: Acute kidney failure, unspecified: N17.9

## 2014-10-23 LAB — CBC
HEMATOCRIT: 33.9 % — AB (ref 39.0–52.0)
HEMOGLOBIN: 11.9 g/dL — AB (ref 13.0–17.0)
MCH: 30.2 pg (ref 26.0–34.0)
MCHC: 35.1 g/dL (ref 30.0–36.0)
MCV: 86 fL (ref 78.0–100.0)
Platelets: 249 10*3/uL (ref 150–400)
RBC: 3.94 MIL/uL — ABNORMAL LOW (ref 4.22–5.81)
RDW: 12.6 % (ref 11.5–15.5)
WBC: 6.7 10*3/uL (ref 4.0–10.5)

## 2014-10-23 LAB — URINALYSIS, ROUTINE W REFLEX MICROSCOPIC
Bilirubin Urine: NEGATIVE
GLUCOSE, UA: NEGATIVE mg/dL
HGB URINE DIPSTICK: NEGATIVE
KETONES UR: 15 mg/dL — AB
Leukocytes, UA: NEGATIVE
Nitrite: NEGATIVE
PROTEIN: NEGATIVE mg/dL
Specific Gravity, Urine: 1.018 (ref 1.005–1.030)
UROBILINOGEN UA: 0.2 mg/dL (ref 0.0–1.0)
pH: 5 (ref 5.0–8.0)

## 2014-10-23 LAB — COMPREHENSIVE METABOLIC PANEL
ALBUMIN: 4 g/dL (ref 3.5–5.0)
ALT: 11 U/L — ABNORMAL LOW (ref 17–63)
ANION GAP: 12 (ref 5–15)
AST: 26 U/L (ref 15–41)
Alkaline Phosphatase: 49 U/L (ref 38–126)
BUN: 27 mg/dL — AB (ref 6–20)
CO2: 23 mmol/L (ref 22–32)
CREATININE: 2.41 mg/dL — AB (ref 0.61–1.24)
Calcium: 9.5 mg/dL (ref 8.9–10.3)
Chloride: 99 mmol/L — ABNORMAL LOW (ref 101–111)
GFR, EST AFRICAN AMERICAN: 30 mL/min — AB (ref >60)
GFR, EST NON AFRICAN AMERICAN: 26 mL/min — AB (ref >60)
Glucose, Bld: 233 mg/dL — ABNORMAL HIGH (ref 65–99)
POTASSIUM: 3.9 mmol/L (ref 3.5–5.1)
Sodium: 134 mmol/L — ABNORMAL LOW (ref 135–145)
TOTAL PROTEIN: 7 g/dL (ref 6.5–8.1)
Total Bilirubin: 1 mg/dL (ref 0.3–1.2)

## 2014-10-23 LAB — LIPASE, BLOOD: Lipase: 43 U/L (ref 22–51)

## 2014-10-23 LAB — I-STAT CG4 LACTIC ACID, ED: Lactic Acid, Venous: 3.42 mmol/L (ref 0.5–2.0)

## 2014-10-23 MED ORDER — MORPHINE SULFATE 4 MG/ML IJ SOLN
4.0000 mg | Freq: Once | INTRAMUSCULAR | Status: AC
  Administered 2014-10-23: 4 mg via INTRAVENOUS
  Filled 2014-10-23: qty 1

## 2014-10-23 MED ORDER — SODIUM CHLORIDE 0.9 % IV BOLUS (SEPSIS)
1000.0000 mL | Freq: Once | INTRAVENOUS | Status: AC
  Administered 2014-10-23: 1000 mL via INTRAVENOUS

## 2014-10-23 MED ORDER — ONDANSETRON HCL 4 MG/2ML IJ SOLN
4.0000 mg | Freq: Once | INTRAMUSCULAR | Status: AC
  Administered 2014-10-23: 4 mg via INTRAVENOUS
  Filled 2014-10-23: qty 2

## 2014-10-23 MED ORDER — MORPHINE SULFATE 4 MG/ML IJ SOLN: 4.0000 mg | Freq: Once | INTRAMUSCULAR | Status: DC

## 2014-10-23 MED ORDER — LEVOFLOXACIN IN D5W 500 MG/100ML IV SOLN
500.0000 mg | Freq: Once | INTRAVENOUS | Status: AC
  Administered 2014-10-24: 500 mg via INTRAVENOUS
  Filled 2014-10-23: qty 100

## 2014-10-23 MED ORDER — ONDANSETRON HCL 4 MG/2ML IJ SOLN: 4.0000 mg | Freq: Once | INTRAMUSCULAR | Status: DC

## 2014-10-23 MED ORDER — IOHEXOL 300 MG/ML  SOLN
25.0000 mL | INTRAMUSCULAR | Status: AC
  Administered 2014-10-23 (×2): 25 mL via ORAL

## 2014-10-23 NOTE — ED Notes (Signed)
Pt reports being seen 09-22-14 for left sided abd pain and flank pain.  Dx w/prostatitis and started on Cipro.  Pt is still having abd and flank pain.  No urinary symptoms or fevers.  Pt was to have PCP on 09-29-14 recheck creatinine level, PCP is referring pt to kidney specialist.

## 2014-10-23 NOTE — ED Notes (Signed)
Pt does not want an IV or CT scan but wants to be DC'd with pain meds.

## 2014-10-23 NOTE — ED Provider Notes (Signed)
CSN: [id number]     Arrival date & time 10/23/14  1350 History   First MD Initiated Contact with Patient 10/23/14 1809     Chief Complaint  Patient presents with  . Abdominal Pain     (Consider location/radiation/quality/duration/timing/severity/associated sxs/prior Treatment) HPI   PCP: Nolene Ebbs A, MD Blood pressure 137/73, pulse 95, temperature 97.9 F (36.6 C), temperature source Oral, resp. rate 16, SpO2 100 %.  Leonard Stone is a 69 y.o.male with a significant PMH of hypertension, CAD, diabetes presents to the ER with complaints of abdominal pain.    His pain has been bothering him for the past month, he was seen for it 1 month ago and had a CT scan done in the ER which was unremarkable. At this visit it was noted that his kidney function was elevated, he was told to f/u with his PCP and his PCP has now referred him to a nephrologist. He has not yet seen the nephrologist, The cause of his abnormal kidney function is unknown. He says the pain is only at night or in the morning and it is severe. This morning he had the pain again and wanted to be able to get some pain medication for when his stomach hurts. He reports the pain moves from left to right, from left upper abdomen but left lower abdomen. He asks to get some medication for pain would prefer to go home since he has been waiting a long time due to a busy department. He is currently pain free.  The patient denies diaphoresis, fever, headache, weakness (general or focal), confusion, change of vision,  neck pain, dysphagia, aphagia, chest pain, shortness of breath,  back pain, lower extremity swelling, rash.   Past Medical History  Diagnosis Date  . Hypertension   . Coronary artery disease   . Diabetes mellitus    Past Surgical History  Procedure Laterality Date  . Appendectomy     History reviewed. No pertinent family history. History  Substance Use Topics  . Smoking status: Current Every Day Smoker -- 0.25 packs/day     Types: Cigarettes  . Smokeless tobacco: Not on file  . Alcohol Use: Yes     Comment: occ    Review of Systems  10 Systems reviewed and are negative for acute change except as noted in the HPI.    Allergies  Vicodin  Home Medications   Prior to Admission medications   Medication Sig Start Date End Date Taking? Authorizing Provider  gabapentin (NEURONTIN) 100 MG capsule Take 1 capsule (100 mg total) by mouth 3 (three) times daily. 06/29/14  Yes Nicole Pisciotta, PA-C  gabapentin (NEURONTIN) 300 MG capsule Take 300 mg by mouth 3 (three) times daily.   Yes Historical Provider, MD  lisinopril-hydrochlorothiazide (PRINZIDE,ZESTORETIC) 20-12.5 MG per tablet Take 1 tablet by mouth daily.   Yes Historical Provider, MD  metFORMIN (GLUCOPHAGE) 1000 MG tablet Take 1 tablet (1,000 mg total) by mouth 2 (two) times daily with a meal. 08/27/13  Yes Linton Flemings, MD   BP 146/90 mmHg  Pulse 79  Temp(Src) 97.9 F (36.6 C) (Oral)  Resp 16  SpO2 100% Physical Exam  Constitutional: He appears well-developed and well-nourished. No distress.  HENT:  Head: Normocephalic and atraumatic.  Eyes: Pupils are equal, round, and reactive to light.  Neck: Normal range of motion. Neck supple.  Cardiovascular: Normal rate and regular rhythm.   Pulmonary/Chest: Effort normal.  Abdominal: Soft. Bowel sounds are normal. He exhibits no distension. There is  tenderness (mild diffuse). There is no rigidity, no rebound, no guarding and no CVA tenderness.  Musculoskeletal:  No lower extremity swelling.  Neurological: He is alert.  Skin: Skin is warm and dry.  Nursing note and vitals reviewed.   ED Course  Procedures (including critical care time) Labs Review Labs Reviewed  COMPREHENSIVE METABOLIC PANEL - Abnormal; Notable for the following:    Sodium 134 (*)    Chloride 99 (*)    Glucose, Bld 233 (*)    BUN 27 (*)    Creatinine, Ser 2.41 (*)    ALT 11 (*)    GFR calc non Af Amer 26 (*)    GFR calc Af  Amer 30 (*)    All other components within normal limits  CBC - Abnormal; Notable for the following:    RBC 3.94 (*)    Hemoglobin 11.9 (*)    HCT 33.9 (*)    All other components within normal limits  URINALYSIS, ROUTINE W REFLEX MICROSCOPIC (NOT AT [facility]) - Abnormal; Notable for the following:    Ketones, ur 15 (*)    All other components within normal limits  I-STAT CG4 LACTIC ACID, ED - Abnormal; Notable for the following:    Lactic Acid, Venous 3.42 (*)    All other components within normal limits  LIPASE, BLOOD  I-STAT CG4 LACTIC ACID, ED    Imaging Review Ct Abdomen Pelvis Wo Contrast  10/23/2014   CLINICAL DATA:  Acute onset of left-sided abdominal pain and flank pain. Initial encounter.  EXAM: CT ABDOMEN AND PELVIS WITHOUT CONTRAST  TECHNIQUE: Multidetector CT imaging of the abdomen and pelvis was performed following the standard protocol without IV contrast.  COMPARISON:  CT of the abdomen and pelvis performed 09/22/2014  FINDINGS: Minimal left basilar atelectasis is noted.  The patient's left hepatic lobe hemangioma measures 3.9 cm, similar in appearance to the prior study. Minimal additional vague hepatic hypodensities are stable in appearance. The spleen is unremarkable in appearance. The gallbladder is within normal limits. The pancreas and adrenal glands are unremarkable.  The kidneys are unremarkable in appearance. There is no evidence of hydronephrosis. No renal or ureteral stones are seen. No perinephric stranding is appreciated.  No free fluid is identified. The small bowel is unremarkable in appearance. The stomach is within normal limits. No acute vascular abnormalities are seen. Scattered calcification is noted along the abdominal aorta and its branches.  The appendix is not definitely seen. The colon is unremarkable in appearance.  The bladder is mildly distended and grossly unremarkable in appearance. The prostate remains normal in size. Mild vague soft tissue stranding about  the prostate could reflect clinically described prostatitis. No inguinal lymphadenopathy is seen.  No acute osseous abnormalities are identified. Vacuum phenomenon and mild degenerative change are seen at L5-S1.  IMPRESSION: 1. Mild vague soft tissue stranding about the prostate could reflect clinically described prostatitis. 2. Otherwise no acute abnormality seen within the abdomen or pelvis. 3. Left hepatic lobe hemangioma appears grossly stable. Additional minimal vague hypodensities within the liver are stable and likely benign. 4. Scattered calcification along the abdominal aorta and its branches.   Electronically Signed   By: Garald Balding M.D.   On: 10/23/2014 23:09     EKG Interpretation None      MDM   Final diagnoses:  Renal insufficiency  Acute prostatitis  Elevated serum lactate dehydrogenase    Patients urinalysis is unremarkable. Normal Lipase and non acute CBC. Her CMP is abnormal with  an elevated glucose at 233, elevated BUN, creatinine and GFR worse than previously. He is currently waiting to be seen by nephrology.  I suggested to the patient that we get IV access, control his pain and a CT scan. He refuses. He does not want an IV and he does not want any imaging. He would like pain medication for home and to follow-up with nephrology.  I discussed case with Dr. Wyvonnia Dusky who has seen the patient as well convinced him to stay and get a scan and more blood work. Mr. Passage has an elevated lactic acid at 3.42 and has an elevate creatinine and BUN and his GFR is decreased. He has been having difficulty obtaining access to nephrology. CT scan shows inflammation to the prostate concerning for prostatitis.  Will call the hospitalist to request admission. IV Levaquin initiated and fluids. Patients pain managed.   [facility] has agreed to admit patient. Declined temp admit orders.  Filed Vitals:   10/25/14 0855  BP: 159/82  Pulse: 73  Temp: 98.7 F (37.1 C)  Resp: [address] PA-C 10/25/14 2237  Ezequiel Essex, MD 10/26/14 [phone]

## 2014-10-24 ENCOUNTER — Inpatient Hospital Stay (HOSPITAL_COMMUNITY): Payer: Medicare Other

## 2014-10-24 ENCOUNTER — Encounter (HOSPITAL_COMMUNITY): Payer: Self-pay | Admitting: General Practice

## 2014-10-24 DIAGNOSIS — R351 Nocturia: Secondary | ICD-10-CM | POA: Diagnosis present

## 2014-10-24 DIAGNOSIS — F17210 Nicotine dependence, cigarettes, uncomplicated: Secondary | ICD-10-CM | POA: Diagnosis present

## 2014-10-24 DIAGNOSIS — N179 Acute kidney failure, unspecified: Secondary | ICD-10-CM | POA: Diagnosis not present

## 2014-10-24 DIAGNOSIS — N401 Enlarged prostate with lower urinary tract symptoms: Secondary | ICD-10-CM | POA: Diagnosis present

## 2014-10-24 DIAGNOSIS — E119 Type 2 diabetes mellitus without complications: Secondary | ICD-10-CM | POA: Diagnosis not present

## 2014-10-24 DIAGNOSIS — E1165 Type 2 diabetes mellitus with hyperglycemia: Secondary | ICD-10-CM | POA: Diagnosis present

## 2014-10-24 DIAGNOSIS — R1030 Lower abdominal pain, unspecified: Secondary | ICD-10-CM | POA: Diagnosis not present

## 2014-10-24 DIAGNOSIS — R079 Chest pain, unspecified: Secondary | ICD-10-CM | POA: Diagnosis not present

## 2014-10-24 DIAGNOSIS — E1140 Type 2 diabetes mellitus with diabetic neuropathy, unspecified: Secondary | ICD-10-CM | POA: Diagnosis present

## 2014-10-24 DIAGNOSIS — R3914 Feeling of incomplete bladder emptying: Secondary | ICD-10-CM | POA: Diagnosis present

## 2014-10-24 DIAGNOSIS — E872 Acidosis: Secondary | ICD-10-CM | POA: Diagnosis present

## 2014-10-24 DIAGNOSIS — Z79899 Other long term (current) drug therapy: Secondary | ICD-10-CM | POA: Diagnosis not present

## 2014-10-24 DIAGNOSIS — R109 Unspecified abdominal pain: Secondary | ICD-10-CM | POA: Diagnosis not present

## 2014-10-24 DIAGNOSIS — N410 Acute prostatitis: Secondary | ICD-10-CM | POA: Diagnosis not present

## 2014-10-24 DIAGNOSIS — N419 Inflammatory disease of prostate, unspecified: Secondary | ICD-10-CM | POA: Diagnosis present

## 2014-10-24 DIAGNOSIS — I2510 Atherosclerotic heart disease of native coronary artery without angina pectoris: Secondary | ICD-10-CM | POA: Diagnosis present

## 2014-10-24 DIAGNOSIS — E1122 Type 2 diabetes mellitus with diabetic chronic kidney disease: Secondary | ICD-10-CM | POA: Diagnosis not present

## 2014-10-24 DIAGNOSIS — I129 Hypertensive chronic kidney disease with stage 1 through stage 4 chronic kidney disease, or unspecified chronic kidney disease: Secondary | ICD-10-CM | POA: Diagnosis present

## 2014-10-24 DIAGNOSIS — N411 Chronic prostatitis: Secondary | ICD-10-CM

## 2014-10-24 DIAGNOSIS — N183 Chronic kidney disease, stage 3 (moderate): Secondary | ICD-10-CM | POA: Diagnosis present

## 2014-10-24 DIAGNOSIS — E860 Dehydration: Secondary | ICD-10-CM | POA: Diagnosis not present

## 2014-10-24 DIAGNOSIS — Z885 Allergy status to narcotic agent status: Secondary | ICD-10-CM | POA: Diagnosis not present

## 2014-10-24 DIAGNOSIS — I10 Essential (primary) hypertension: Secondary | ICD-10-CM | POA: Diagnosis not present

## 2014-10-24 HISTORY — DX: Acute kidney failure, unspecified: N17.9

## 2014-10-24 LAB — GLUCOSE, CAPILLARY
GLUCOSE-CAPILLARY: 170 mg/dL — AB (ref 65–99)
Glucose-Capillary: 203 mg/dL — ABNORMAL HIGH (ref 65–99)
Glucose-Capillary: 205 mg/dL — ABNORMAL HIGH (ref 65–99)
Glucose-Capillary: 225 mg/dL — ABNORMAL HIGH (ref 65–99)

## 2014-10-24 LAB — COMPREHENSIVE METABOLIC PANEL
ALT: 9 U/L — AB (ref 17–63)
AST: 15 U/L (ref 15–41)
Albumin: 3.5 g/dL (ref 3.5–5.0)
Alkaline Phosphatase: 35 U/L — ABNORMAL LOW (ref 38–126)
Anion gap: 10 (ref 5–15)
BILIRUBIN TOTAL: 0.8 mg/dL (ref 0.3–1.2)
BUN: 16 mg/dL (ref 6–20)
CALCIUM: 8.8 mg/dL — AB (ref 8.9–10.3)
CHLORIDE: 105 mmol/L (ref 101–111)
CO2: 23 mmol/L (ref 22–32)
CREATININE: 1.62 mg/dL — AB (ref 0.61–1.24)
GFR, EST AFRICAN AMERICAN: 49 mL/min — AB (ref >60)
GFR, EST NON AFRICAN AMERICAN: 42 mL/min — AB (ref >60)
Glucose, Bld: 158 mg/dL — ABNORMAL HIGH (ref 65–99)
Potassium: 4.1 mmol/L (ref 3.5–5.1)
Sodium: 138 mmol/L (ref 135–145)
Total Protein: 6.4 g/dL — ABNORMAL LOW (ref 6.5–8.1)

## 2014-10-24 LAB — CBC
HCT: 30.1 % — ABNORMAL LOW (ref 39.0–52.0)
HEMATOCRIT: 30.3 % — AB (ref 39.0–52.0)
Hemoglobin: 10.6 g/dL — ABNORMAL LOW (ref 13.0–17.0)
Hemoglobin: 10.6 g/dL — ABNORMAL LOW (ref 13.0–17.0)
MCH: 30.2 pg (ref 26.0–34.0)
MCH: 30.4 pg (ref 26.0–34.0)
MCHC: 35 g/dL (ref 30.0–36.0)
MCHC: 35.2 g/dL (ref 30.0–36.0)
MCV: 86.2 fL (ref 78.0–100.0)
MCV: 86.3 fL (ref 78.0–100.0)
PLATELETS: 235 10*3/uL (ref 150–400)
Platelets: 230 10*3/uL (ref 150–400)
RBC: 3.49 MIL/uL — AB (ref 4.22–5.81)
RBC: 3.51 MIL/uL — ABNORMAL LOW (ref 4.22–5.81)
RDW: 12.8 % (ref 11.5–15.5)
RDW: 12.7 % (ref 11.5–15.5)
WBC: 5.2 10*3/uL (ref 4.0–10.5)
WBC: 5.4 10*3/uL (ref 4.0–10.5)

## 2014-10-24 LAB — CREATININE, SERUM
Creatinine, Ser: 1.66 mg/dL — ABNORMAL HIGH (ref 0.61–1.24)
GFR calc Af Amer: 48 mL/min — ABNORMAL LOW (ref >60)
GFR calc non Af Amer: 41 mL/min — ABNORMAL LOW (ref >60)

## 2014-10-24 LAB — TSH: TSH: 2.098 u[IU]/mL (ref 0.350–4.500)

## 2014-10-24 MED ORDER — ACETAMINOPHEN 650 MG RE SUPP: 650.0000 mg | Freq: Four times a day (QID) | RECTAL | Status: DC | PRN

## 2014-10-24 MED ORDER — FOLIC ACID 1 MG PO TABS
1.0000 mg | ORAL_TABLET | Freq: Every day | ORAL | Status: DC
  Administered 2014-10-24 – 2014-10-25 (×2): 1 mg via ORAL
  Filled 2014-10-24 (×2): qty 1

## 2014-10-24 MED ORDER — ONDANSETRON HCL 4 MG PO TABS: 4.0000 mg | ORAL_TABLET | Freq: Four times a day (QID) | ORAL | Status: DC | PRN

## 2014-10-24 MED ORDER — INSULIN ASPART 100 UNIT/ML ~~LOC~~ SOLN
0.0000 [IU] | Freq: Three times a day (TID) | SUBCUTANEOUS | Status: DC
  Administered 2014-10-25: 3 [IU] via SUBCUTANEOUS
  Administered 2014-10-25: 2 [IU] via SUBCUTANEOUS

## 2014-10-24 MED ORDER — ACETAMINOPHEN 325 MG PO TABS
650.0000 mg | ORAL_TABLET | Freq: Four times a day (QID) | ORAL | Status: DC | PRN
  Administered 2014-10-24: 650 mg via ORAL
  Filled 2014-10-24: qty 2

## 2014-10-24 MED ORDER — SODIUM CHLORIDE 0.9 % IV SOLN
INTRAVENOUS | Status: DC
  Administered 2014-10-24 – 2014-10-25 (×2): via INTRAVENOUS

## 2014-10-24 MED ORDER — METRONIDAZOLE 500 MG PO TABS
500.0000 mg | ORAL_TABLET | Freq: Three times a day (TID) | ORAL | Status: DC
  Administered 2014-10-24 – 2014-10-25 (×4): 500 mg via ORAL
  Filled 2014-10-24 (×4): qty 1

## 2014-10-24 MED ORDER — GABAPENTIN 300 MG PO CAPS
300.0000 mg | ORAL_CAPSULE | Freq: Three times a day (TID) | ORAL | Status: DC
  Administered 2014-10-24 – 2014-10-25 (×4): 300 mg via ORAL
  Filled 2014-10-24 (×4): qty 1

## 2014-10-24 MED ORDER — DEXTROSE 5 % IV SOLN
1.0000 g | INTRAVENOUS | Status: DC
  Administered 2014-10-25: 1 g via INTRAVENOUS
  Filled 2014-10-24 (×3): qty 10

## 2014-10-24 MED ORDER — ADULT MULTIVITAMIN W/MINERALS CH
1.0000 | ORAL_TABLET | Freq: Every day | ORAL | Status: DC
  Administered 2014-10-24 – 2014-10-25 (×2): 1 via ORAL
  Filled 2014-10-24 (×2): qty 1

## 2014-10-24 MED ORDER — ONDANSETRON HCL 4 MG/2ML IJ SOLN
4.0000 mg | Freq: Four times a day (QID) | INTRAMUSCULAR | Status: DC | PRN
  Administered 2014-10-24 (×2): 4 mg via INTRAVENOUS
  Filled 2014-10-24 (×2): qty 2

## 2014-10-24 MED ORDER — ENSURE ENLIVE PO LIQD
237.0000 mL | Freq: Two times a day (BID) | ORAL | Status: DC
  Administered 2014-10-25 (×2): 237 mL via ORAL

## 2014-10-24 MED ORDER — HEPARIN SODIUM (PORCINE) 5000 UNIT/ML IJ SOLN
5000.0000 [IU] | Freq: Three times a day (TID) | INTRAMUSCULAR | Status: DC
  Administered 2014-10-24 – 2014-10-25 (×3): 5000 [IU] via SUBCUTANEOUS
  Filled 2014-10-24 (×2): qty 1

## 2014-10-24 MED ORDER — DEXTROSE 5 % IV SOLN
2.0000 g | Freq: Once | INTRAVENOUS | Status: AC
  Administered 2014-10-24: 2 g via INTRAVENOUS
  Filled 2014-10-24: qty 2

## 2014-10-24 MED ORDER — VITAMIN B-1 100 MG PO TABS
100.0000 mg | ORAL_TABLET | Freq: Every day | ORAL | Status: DC
  Administered 2014-10-24 – 2014-10-25 (×2): 100 mg via ORAL
  Filled 2014-10-24 (×2): qty 1

## 2014-10-24 NOTE — H&P (Signed)
[facility] History and Physical  JARVIN OGREN BSJ:[id number] DOB: [birth year] DOA: 10/23/2014  Referring physician: Delos Haring, PA PCP: Philis Fendt, MD   Chief Complaint: Abdominal pain  HPI: Leonard Stone is a 69 y.o. male with prior history of HTN Type 2 DM presents to the ED with abdominal pain. Patient states that he has had this pain going on for over a month. Patient states that he was seen in the ED a month ago and at that time he was told he had a prostate infection. Patient was started on cipro and was sent home. Patient states that he had follow up in the PCP office and was noted to have an elevation of his creatinine. He now presented to the ED with more pain and has been not able to control as an outpatient. Patient states that there is no nausea and no vomiting noted. He states that he has no fever noted. No chills noted. Patient in the ED was noted to have an elevated lactate and a CT of the abdomen shows that he still has prostatitis. A urine that was checked does not seem to show presence of fulmanent UTI at this time. The concern is due to the elevated lactate but he is not exhibiting other signs of sepsis with a normal WBC and no hypotension or end organ damage.   Review of Systems:  12 point ROS performed and is unremarkable other than HPI  Past Medical History  Diagnosis Date  . Hypertension   . Coronary artery disease   . Diabetes mellitus    Past Surgical History  Procedure Laterality Date  . Appendectomy     Social History:  reports that he has been smoking Cigarettes.  He has been smoking about 0.25 packs per day. He does not have any smokeless tobacco history on file. He reports that he drinks alcohol. He reports that he does not use illicit drugs.  Allergies  Allergen Reactions  . Vicodin [Hydrocodone-Acetaminophen] Itching and Rash    History reviewed. No pertinent family history.   Prior to Admission medications   Medication Sig Start  Date End Date Taking? Authorizing Provider  gabapentin (NEURONTIN) 100 MG capsule Take 1 capsule (100 mg total) by mouth 3 (three) times daily. 06/29/14  Yes Nicole Pisciotta, PA-C  gabapentin (NEURONTIN) 300 MG capsule Take 300 mg by mouth 3 (three) times daily.   Yes Historical Provider, MD  lisinopril-hydrochlorothiazide (PRINZIDE,ZESTORETIC) 20-12.5 MG per tablet Take 1 tablet by mouth daily.   Yes Historical Provider, MD  metFORMIN (GLUCOPHAGE) 1000 MG tablet Take 1 tablet (1,000 mg total) by mouth 2 (two) times daily with a meal. 08/27/13  Yes Linton Flemings, MD   Physical Exam: Filed Vitals:   10/23/14 2216 10/23/14 2230 10/23/14 2345 10/24/14 0000  BP: 160/94 146/90 155/90 160/89  Pulse: 90 79 77 77  Temp:      TempSrc:      Resp: 16     SpO2: 100% 100% 100% 100%    Wt Readings from Last 3 Encounters:  No data found for Wt    General:  Appears calm and comfortable Eyes: PERRL, normal lids, irises & conjunctiva ENT: grossly normal hearing, lips & tongue Neck: no LAD, masses or thyromegaly Cardiovascular: RRR, no m/r/g. No LE edema. Respiratory: CTA bilaterally, no w/r/r. Normal respiratory effort. Abdomen: soft, ntnd Skin: no rash or induration seen on limited exam Musculoskeletal: grossly normal tone BUE/BLE Psychiatric: grossly normal mood and affect Neurologic: grossly non-focal.  Labs on Admission:  Basic Metabolic Panel:  Recent Labs Lab 10/23/14 1446  NA 134*  K 3.9  CL 99*  CO2 23  GLUCOSE 233*  BUN 27*  CREATININE 2.41*  CALCIUM 9.5   Liver Function Tests:  Recent Labs Lab 10/23/14 1446  AST 26  ALT 11*  ALKPHOS 49  BILITOT 1.0  PROT 7.0  ALBUMIN 4.0    Recent Labs Lab 10/23/14 1446  LIPASE 43   No results for input(s): AMMONIA in the last 168 hours. CBC:  Recent Labs Lab 10/23/14 1446  WBC 6.7  HGB 11.9*  HCT 33.9*  MCV 86.0  PLT 249   Cardiac Enzymes: No results for input(s): CKTOTAL, CKMB, CKMBINDEX, TROPONINI in the  last 168 hours.  BNP (last 3 results) No results for input(s): BNP in the last 8760 hours.  ProBNP (last 3 results) No results for input(s): PROBNP in the last 8760 hours.  CBG: No results for input(s): GLUCAP in the last 168 hours.  Radiological Exams on Admission: Ct Abdomen Pelvis Wo Contrast  10/23/2014   CLINICAL DATA:  Acute onset of left-sided abdominal pain and flank pain. Initial encounter.  EXAM: CT ABDOMEN AND PELVIS WITHOUT CONTRAST  TECHNIQUE: Multidetector CT imaging of the abdomen and pelvis was performed following the standard protocol without IV contrast.  COMPARISON:  CT of the abdomen and pelvis performed 09/22/2014  FINDINGS: Minimal left basilar atelectasis is noted.  The patient's left hepatic lobe hemangioma measures 3.9 cm, similar in appearance to the prior study. Minimal additional vague hepatic hypodensities are stable in appearance. The spleen is unremarkable in appearance. The gallbladder is within normal limits. The pancreas and adrenal glands are unremarkable.  The kidneys are unremarkable in appearance. There is no evidence of hydronephrosis. No renal or ureteral stones are seen. No perinephric stranding is appreciated.  No free fluid is identified. The small bowel is unremarkable in appearance. The stomach is within normal limits. No acute vascular abnormalities are seen. Scattered calcification is noted along the abdominal aorta and its branches.  The appendix is not definitely seen. The colon is unremarkable in appearance.  The bladder is mildly distended and grossly unremarkable in appearance. The prostate remains normal in size. Mild vague soft tissue stranding about the prostate could reflect clinically described prostatitis. No inguinal lymphadenopathy is seen.  No acute osseous abnormalities are identified. Vacuum phenomenon and mild degenerative change are seen at L5-S1.  IMPRESSION: 1. Mild vague soft tissue stranding about the prostate could reflect clinically  described prostatitis. 2. Otherwise no acute abnormality seen within the abdomen or pelvis. 3. Left hepatic lobe hemangioma appears grossly stable. Additional minimal vague hypodensities within the liver are stable and likely benign. 4. Scattered calcification along the abdominal aorta and its branches.   Electronically Signed   By: Garald Balding M.D.   On: 10/23/2014 23:09      Assessment/Plan Principal Problem:   AKI (acute kidney injury) Active Problems:   Abdominal pain   HTN (hypertension)   Diabetes mellitus   1. AKI -likely due to dehydration at this time -will admit for IV fluids and hydration -will monitor labs -would consider getting a Nephrology consult and consider urology evaluation -will hold metformin for now as well as zestoretic  2. Prostatitis -will start on rocephin for now -consider urology evaluation  3. Abdominal Pain -unclear etiology will treat with pain meds as needed -CT scan results noted unremarkable other than prostate findings  4. DM Type 2 -will place on  SSI and monitor FSBS -Order A1C  5. HTN -will monitor pressures -holding antihypertensives for now due to renal function decline   Code Status: full code (must indicate code status--if unknown or must be presumed, indicate so) DVT Prophylaxis:SCD Family Communication: none (indicate person spoken with, if applicable, with phone number if by telephone) Disposition Plan: home (indicate anticipated LOS)  Time spent: 24min  Aarushi Hemric A [facility] Pager [phone]

## 2014-10-24 NOTE — Progress Notes (Signed)
Patient seen and examined. Admitted after midnight secondary to abd pain, signs of dehydration and abnormal CT abd/pelvis demonstrating prostatitis. Patient denies nausea, vomiting and referred to be compliant with medications. In the last 2-3 days he endorses increase abd pain, decrease appetite/intake and also loose stools. In the ER was found to have AKI and signs of dehydration. At home was on lisinopril/HCTZ and Metformin. Please referred to Dr. Humphrey Rolls H&P for further info/details on admission.  Plan: -patient will be hydrated -started on rocephin and flagyl -nephrotoxic agents on hold -will follow clinical response -most likely home in am is stable -will benefit of urology follow up as an outpatient  Leonard Stone [phone]

## 2014-10-24 NOTE — ED Notes (Signed)
Pt refuses to stay on monitor and states he cannot rest on it.

## 2014-10-24 NOTE — ED Notes (Signed)
Heart healthy diet ordered. 

## 2014-10-24 NOTE — Clinical Documentation Improvement (Addendum)
Supporting Information: Noted 65/10 Pharmacy consult for Ceftriaxone, indication: urosepsis.  8/09:  Lactic acid: 3.42.   Possible Clinical Condition: . Urosepsis-MUST specify sepsis with UTI, versus UTI only . Sepsis-specify causative organism if known . Sepsis due to: --Device --Implant --Graft --Infusion . Severe sepsis-sepsis with organ dysfunction --Specify organ dysfunction Respiratory failure Encephalopathy Acute kidney failure Other (specify) . SIRS (Systemic Inflammatory Response Syndrome --With or without organ dysfunction . Document septic shock if present . Document any associated diagnoses/conditions    Thank Sherian Maroon Documentation Specialist [phone] [email or web address]    Patient does not meet criteria for sepsis. Presenting with dehydration and AKI (which is responsible for elevation on lactic acid); also with prostatitis.. Thanks  Barton Dubois [phone]

## 2014-10-24 NOTE — Progress Notes (Signed)
ANTIBIOTIC CONSULT NOTE - INITIAL  Pharmacy Consult for Ceftriaxone Indication: urosepsis  Allergies  Allergen Reactions  . Vicodin [Hydrocodone-Acetaminophen] Itching and Rash    Patient Measurements:    Vital Signs: BP: 158/91 mmHg (08/10 0400) Pulse Rate: 71 (08/10 0400) Intake/Output from previous day: 08/09 0701 - 08/10 0700 In: 1000 [I.V.:1000] Out: 2300 [Urine:2300] Intake/Output from this shift: Total I/O In: 1000 [I.V.:1000] Out: 2300 [Urine:2300]  Labs:  Recent Labs  10/23/14 1446  WBC 6.7  HGB 11.9*  PLT 249  CREATININE 2.41*   CrCl cannot be calculated (Unknown ideal weight.). No results for input(s): VANCOTROUGH, VANCOPEAK, VANCORANDOM, GENTTROUGH, GENTPEAK, GENTRANDOM, TOBRATROUGH, TOBRAPEAK, TOBRARND, AMIKACINPEAK, AMIKACINTROU, AMIKACIN in the last 72 hours.   Microbiology: No results found for this or any previous visit (from the past 720 hour(s)).  Medical History: Past Medical History  Diagnosis Date  . Hypertension   . Coronary artery disease   . Diabetes mellitus     Medications:  See electronic med rec  Assessment: 69 y.o. male presents with flank pain. Noted recent treatment in July 2016 for prostatitis with cipro. To begin Rocephin for urosepsis. Afeb. WBC wnl.   Goal of Therapy:  Resolution of infection  Plan:  Rocephin 1gm IV q24h Pharmacy will sign off - please reconsult if needed  Sherlon Handing, PharmD, BCPS Clinical pharmacist, pager [phone] 10/24/2014,4:54 AM

## 2014-10-25 DIAGNOSIS — E1122 Type 2 diabetes mellitus with diabetic chronic kidney disease: Secondary | ICD-10-CM

## 2014-10-25 DIAGNOSIS — I10 Essential (primary) hypertension: Secondary | ICD-10-CM

## 2014-10-25 DIAGNOSIS — R1030 Lower abdominal pain, unspecified: Secondary | ICD-10-CM

## 2014-10-25 DIAGNOSIS — N189 Chronic kidney disease, unspecified: Secondary | ICD-10-CM

## 2014-10-25 LAB — CBC
HEMATOCRIT: 29.7 % — AB (ref 39.0–52.0)
Hemoglobin: 10.4 g/dL — ABNORMAL LOW (ref 13.0–17.0)
MCH: 30.2 pg (ref 26.0–34.0)
MCHC: 35 g/dL (ref 30.0–36.0)
MCV: 86.3 fL (ref 78.0–100.0)
Platelets: 240 10*3/uL (ref 150–400)
RBC: 3.44 MIL/uL — ABNORMAL LOW (ref 4.22–5.81)
RDW: 12.7 % (ref 11.5–15.5)
WBC: 4.7 10*3/uL (ref 4.0–10.5)

## 2014-10-25 LAB — BASIC METABOLIC PANEL
ANION GAP: 9 (ref 5–15)
BUN: 12 mg/dL (ref 6–20)
CHLORIDE: 105 mmol/L (ref 101–111)
CO2: 25 mmol/L (ref 22–32)
CREATININE: 1.5 mg/dL — AB (ref 0.61–1.24)
Calcium: 9.2 mg/dL (ref 8.9–10.3)
GFR calc Af Amer: 54 mL/min — ABNORMAL LOW (ref >60)
GFR calc non Af Amer: 46 mL/min — ABNORMAL LOW (ref >60)
Glucose, Bld: 185 mg/dL — ABNORMAL HIGH (ref 65–99)
Potassium: 4.1 mmol/L (ref 3.5–5.1)
Sodium: 139 mmol/L (ref 135–145)

## 2014-10-25 LAB — URINE CULTURE: CULTURE: NO GROWTH

## 2014-10-25 LAB — GLUCOSE, CAPILLARY
GLUCOSE-CAPILLARY: 212 mg/dL — AB (ref 65–99)
Glucose-Capillary: 174 mg/dL — ABNORMAL HIGH (ref 65–99)

## 2014-10-25 LAB — HEMOGLOBIN A1C
Hgb A1c MFr Bld: 8.4 % — ABNORMAL HIGH (ref 4.8–5.6)
MEAN PLASMA GLUCOSE: 194 mg/dL

## 2014-10-25 MED ORDER — TAMSULOSIN HCL 0.4 MG PO CAPS: 0.4000 mg | ORAL_CAPSULE | Freq: Every day | ORAL | Status: DC

## 2014-10-25 MED ORDER — TAMSULOSIN HCL 0.4 MG PO CAPS: 0.4000 mg | ORAL_CAPSULE | Freq: Every day | ORAL | Status: AC

## 2014-10-25 MED ORDER — AMLODIPINE BESYLATE 10 MG PO TABS: 10.0000 mg | ORAL_TABLET | Freq: Every day | ORAL | Status: DC

## 2014-10-25 MED ORDER — METRONIDAZOLE 500 MG PO TABS: 500.0000 mg | ORAL_TABLET | Freq: Three times a day (TID) | ORAL | Status: DC

## 2014-10-25 MED ORDER — SACCHAROMYCES BOULARDII 250 MG PO CAPS: 250.0000 mg | ORAL_CAPSULE | Freq: Two times a day (BID) | ORAL | Status: DC

## 2014-10-25 MED ORDER — GLIPIZIDE 5 MG PO TABS: 5.0000 mg | ORAL_TABLET | Freq: Two times a day (BID) | ORAL | Status: AC

## 2014-10-25 MED ORDER — CEFUROXIME AXETIL 500 MG PO TABS: 500.0000 mg | ORAL_TABLET | Freq: Two times a day (BID) | ORAL | Status: DC

## 2014-10-25 MED ORDER — FUROSEMIDE 20 MG PO TABS: 20.0000 mg | ORAL_TABLET | Freq: Every day | ORAL | Status: AC

## 2014-10-25 NOTE — Discharge Summary (Addendum)
Physician Discharge Summary  Leonard Stone VZD:[id number] DOB: 08/03/1945 DOA: 10/23/2014  PCP: Philis Fendt, MD  Admit date: 10/23/2014 Discharge date: 10/25/2014  Time spent: >30 minutes  Recommendations for Outpatient Follow-up:  1. Close follow up of patient CBG's and adjust hypoglycemic agents as needed A1C 8.4 2. Please repeat BMET to follow electrolytes and renal function 3. 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 Discharge home with instructions to follow with PCP and urology as an outpatient  Diet recommendation: low carbohydrates and heart healthy diet  Filed Weights   10/24/14 2030  Weight: 71.668 kg (158 lb)    History of present illness:  69 y.o. male with prior history of HTN, Type 2 DM and CAD; who presented to the ED with abdominal pain, nocturia and sensation of not emptying bladder completely. Patient states that he has had this pain going on for over a month. Patient states that he was seen in the ED approx a month ago and at that time he was told he had a UTI and prostate infection. Patient was started on cipro and was sent home. Patient states that he had follow up in the PCP office and was noted to have an elevation of his creatinine. He now presented to the ED with more pain and was found to have worsening renal function, elevated lactate and prostatitis on CT abd/pelvis examination. Patient states that there is no nausea and no vomiting noted. He stated that he has no fever or chills noted.   Hospital Course:  1-prostatitis and BPH symptoms -will start flomax empirically -patient will be treated for 10 days with ceftin and flagyl -advised to maintain adequate hydration -follow up with urology as an outpatient  2-acute on chronic renal failure: stage  2-3 at baseline, base on prior GFR -will discontinue nephrotoxic agents (HCTZ, lisinopril and Metformin) -advised to maintain good hydration -will treat above infection  -BMET during follow up visit -?? Progression of his disease due to HTN/Diabetes  3-HTN:  -advise to follow heart healthy diet -will use amlodipine and lasix -will follow VS and adjust antihypertensive agents as needed during follow up visit  4-Diabetes type 2: uncontrolled -patient instructed to follow low carb diet -metformin discontinue due to renal function -discharge on glipizide -A1C 8.4 -follow up with PCP for further adjustments to hypoglycemic regimen  5-Neuropathy: associated with DM -continue Neurontin   6-Lactic acidosis: due to dehydration, use of metformin in this setting and AKI -resolved after IVF's given   Procedures:  See below for x-ray reports   Consultations:  None   Discharge Exam: Filed Vitals:   10/25/14 0527  BP: 132/80  Pulse: 74  Temp: 98.2 F (36.8 C)  Resp: 18    General: afebrile, no nausea, no vomiting and feeling better. Still having nocturia and sensation of not emptying bladder completely  Cardiovascular: no rubs, no gallops Respiratory: CTA bilaterally Abd: soft, NT, ND, positive BS Extremities: no edema or cyanosis   Discharge Instructions   Discharge Instructions    Diet - low sodium heart healthy    Complete by:  As directed      Discharge instructions    Complete by:  As directed   Take medications as prescribed Keep yourself well hydrated Please follow low sodium diet Arrange follow up with PCP in 10  days     Increase activity slowly    Complete by:  As directed           Current Discharge Medication List    START taking these medications   Details  amLODipine (NORVASC) 10 MG tablet Take 1 tablet (10 mg total) by mouth daily. Qty: 30 tablet, Refills: 1    cefUROXime (CEFTIN) 500 MG tablet Take 1 tablet (500 mg total) by mouth 2 (two)  times daily with a meal. Qty: 18 tablet, Refills: 0    furosemide (LASIX) 20 MG tablet Take 1 tablet (20 mg total) by mouth daily. Qty: 30 tablet, Refills: 1    glipiZIDE (GLUCOTROL) 5 MG tablet Take 1 tablet (5 mg total) by mouth 2 (two) times daily before a meal. Qty: 60 tablet, Refills: 1    metroNIDAZOLE (FLAGYL) 500 MG tablet Take 1 tablet (500 mg total) by mouth every 8 (eight) hours. Qty: 27 tablet, Refills: 0    saccharomyces boulardii (FLORASTOR) 250 MG capsule Take 1 capsule (250 mg total) by mouth 2 (two) times daily. Qty: 60 capsule, Refills: 0    tamsulosin (FLOMAX) 0.4 MG CAPS capsule Take 1 capsule (0.4 mg total) by mouth daily after supper. Qty: 30 capsule, Refills: 1      CONTINUE these medications which have NOT CHANGED   Details  gabapentin (NEURONTIN) 100 MG capsule Take 1 capsule (100 mg total) by mouth 3 (three) times daily. Qty: 15 capsule, Refills: 0      STOP taking these medications     lisinopril-hydrochlorothiazide (PRINZIDE,ZESTORETIC) 20-12.5 MG per tablet      metFORMIN (GLUCOPHAGE) 1000 MG tablet        Allergies  Allergen Reactions  . Vicodin [Hydrocodone-Acetaminophen] Itching and Rash   Follow-up Information    Follow up with AVBUERE,EDWIN A, MD. Schedule an appointment as soon as possible for a visit in 10 days.   Specialty:  Internal Medicine   Contact information:   [address] [place] [id number] [phone]       The results of significant diagnostics from this hospitalization (including imaging, microbiology, ancillary and laboratory) are listed below for reference.    Significant Diagnostic Studies: Ct Abdomen Pelvis Wo Contrast  10/23/2014   CLINICAL DATA:  Acute onset of left-sided abdominal pain and flank pain. Initial encounter.  EXAM: CT ABDOMEN AND PELVIS WITHOUT CONTRAST  TECHNIQUE: Multidetector CT imaging of the abdomen and pelvis was performed following the standard protocol without IV contrast.   COMPARISON:  CT of the abdomen and pelvis performed 09/22/2014  FINDINGS: Minimal left basilar atelectasis is noted.  The patient's left hepatic lobe hemangioma measures 3.9 cm, similar in appearance to the prior study. Minimal additional vague hepatic hypodensities are stable in appearance. The spleen is unremarkable in appearance. The gallbladder is within normal limits. The pancreas and adrenal glands are unremarkable.  The kidneys are unremarkable in appearance. There is no evidence of hydronephrosis. No renal or ureteral stones are seen. No perinephric stranding is appreciated.  No free fluid is identified. The small bowel is unremarkable in appearance. The stomach is within normal limits. No acute vascular abnormalities are seen. Scattered calcification is noted along the abdominal aorta and its branches.  The appendix is not definitely seen. The colon is unremarkable in appearance.  The bladder is mildly distended and grossly unremarkable in appearance. The prostate remains normal in size. Mild vague soft tissue stranding about the prostate could reflect clinically described prostatitis. No inguinal lymphadenopathy is seen.  No acute osseous abnormalities are identified. Vacuum phenomenon and mild degenerative change are seen at L5-S1.  IMPRESSION: 1. Mild vague soft tissue stranding about the prostate could reflect clinically described prostatitis. 2. Otherwise no acute abnormality seen within the abdomen or pelvis. 3. Left hepatic lobe hemangioma appears grossly stable. Additional minimal vague hypodensities within the liver are stable and likely benign. 4. Scattered calcification along the abdominal aorta and its branches.   Electronically Signed   By: Garald Balding M.D.   On: 10/23/2014 23:09   Dg Chest Port 1 View  10/24/2014   CLINICAL DATA:  Acute onset of generalized chest pain. Sepsis. Initial encounter.  EXAM: PORTABLE CHEST - 1 VIEW  COMPARISON:  Chest radiograph performed 06/29/2014  FINDINGS:  The lungs are well-aerated and clear. There is no evidence of focal opacification, pleural effusion or pneumothorax.  The cardiomediastinal silhouette is within normal limits. No acute osseous abnormalities are seen.  IMPRESSION: No acute cardiopulmonary process seen.   Electronically Signed   By: Garald Balding M.D.   On: 10/24/2014 05:54    Labs: Basic Metabolic Panel:  Recent Labs Lab 10/23/14 1446 10/24/14 0822 10/25/14 0641  NA 134* 138 139  K 3.9 4.1 4.1  CL 99* 105 105  CO2 23 23 25   GLUCOSE 233* 158* 185*  BUN 27* 16 12  CREATININE 2.41* 1.62*  1.66* 1.50*  CALCIUM 9.5 8.8* 9.2   Liver Function Tests:  Recent Labs Lab 10/23/14 1446 10/24/14 0822  AST 26 15  ALT 11* 9*  ALKPHOS 49 35*  BILITOT 1.0 0.8  PROT 7.0 6.4*  ALBUMIN 4.0 3.5    Recent Labs Lab 10/23/14 1446  LIPASE 43   No results for input(s): AMMONIA in the last 168 hours. CBC:  Recent Labs Lab 10/23/14 1446 10/24/14 0822 10/25/14 0641  WBC 6.7 5.2  5.4 4.7  HGB 11.9* 10.6*  10.6* 10.4*  HCT 33.9* 30.1*  30.3* 29.7*  MCV 86.0 86.2  86.3 86.3  PLT 249 230  235 240   CBG:  Recent Labs Lab 10/24/14 1131 10/24/14 1556 10/24/14 1944 10/24/14 2241 10/25/14 0805  GLUCAP 203* 205* 225* 170* 174*    Signed:  Barton Dubois  [facility] 10/25/2014, 9:19 AM

## 2014-10-29 DIAGNOSIS — I10 Essential (primary) hypertension: Secondary | ICD-10-CM | POA: Diagnosis not present

## 2014-10-29 DIAGNOSIS — F17200 Nicotine dependence, unspecified, uncomplicated: Secondary | ICD-10-CM | POA: Diagnosis not present

## 2014-10-29 DIAGNOSIS — N183 Chronic kidney disease, stage 3 (moderate): Secondary | ICD-10-CM | POA: Diagnosis not present

## 2014-10-29 DIAGNOSIS — E1165 Type 2 diabetes mellitus with hyperglycemia: Secondary | ICD-10-CM | POA: Diagnosis not present

## 2014-10-29 DIAGNOSIS — G589 Mononeuropathy, unspecified: Secondary | ICD-10-CM | POA: Diagnosis not present

## 2014-11-01 DIAGNOSIS — E119 Type 2 diabetes mellitus without complications: Secondary | ICD-10-CM | POA: Diagnosis not present

## 2014-11-01 DIAGNOSIS — H25813 Combined forms of age-related cataract, bilateral: Secondary | ICD-10-CM | POA: Diagnosis not present

## 2014-11-26 DIAGNOSIS — N183 Chronic kidney disease, stage 3 (moderate): Secondary | ICD-10-CM | POA: Diagnosis not present

## 2014-11-26 DIAGNOSIS — I10 Essential (primary) hypertension: Secondary | ICD-10-CM | POA: Diagnosis not present

## 2014-11-26 DIAGNOSIS — E1165 Type 2 diabetes mellitus with hyperglycemia: Secondary | ICD-10-CM | POA: Diagnosis not present

## 2014-11-26 DIAGNOSIS — K5900 Constipation, unspecified: Secondary | ICD-10-CM | POA: Diagnosis not present

## 2014-12-03 DIAGNOSIS — H5703 Miosis: Secondary | ICD-10-CM | POA: Diagnosis not present

## 2014-12-03 DIAGNOSIS — H2512 Age-related nuclear cataract, left eye: Secondary | ICD-10-CM | POA: Diagnosis not present

## 2014-12-03 DIAGNOSIS — H25812 Combined forms of age-related cataract, left eye: Secondary | ICD-10-CM | POA: Diagnosis not present

## 2014-12-04 DIAGNOSIS — Z125 Encounter for screening for malignant neoplasm of prostate: Secondary | ICD-10-CM | POA: Diagnosis not present

## 2014-12-04 DIAGNOSIS — N410 Acute prostatitis: Secondary | ICD-10-CM | POA: Diagnosis not present

## 2014-12-11 ENCOUNTER — Emergency Department (HOSPITAL_COMMUNITY): Payer: Medicare Other

## 2014-12-11 ENCOUNTER — Emergency Department (HOSPITAL_COMMUNITY)
Admission: EM | Admit: 2014-12-11 | Discharge: 2014-12-11 | Disposition: A | Discharge status: Home / Self Care | Payer: Medicare Other | Attending: Emergency Medicine | Admitting: Emergency Medicine

## 2014-12-11 ENCOUNTER — Encounter (HOSPITAL_COMMUNITY): Payer: Self-pay | Admitting: Emergency Medicine

## 2014-12-11 DIAGNOSIS — Z792 Long term (current) use of antibiotics: Secondary | ICD-10-CM | POA: Diagnosis not present

## 2014-12-11 DIAGNOSIS — E119 Type 2 diabetes mellitus without complications: Secondary | ICD-10-CM | POA: Insufficient documentation

## 2014-12-11 DIAGNOSIS — Z720 Tobacco use: Secondary | ICD-10-CM | POA: Diagnosis not present

## 2014-12-11 DIAGNOSIS — R109 Unspecified abdominal pain: Secondary | ICD-10-CM | POA: Diagnosis not present

## 2014-12-11 DIAGNOSIS — R1013 Epigastric pain: Secondary | ICD-10-CM | POA: Insufficient documentation

## 2014-12-11 DIAGNOSIS — K219 Gastro-esophageal reflux disease without esophagitis: Secondary | ICD-10-CM | POA: Insufficient documentation

## 2014-12-11 DIAGNOSIS — I10 Essential (primary) hypertension: Secondary | ICD-10-CM | POA: Insufficient documentation

## 2014-12-11 DIAGNOSIS — I2510 Atherosclerotic heart disease of native coronary artery without angina pectoris: Secondary | ICD-10-CM | POA: Insufficient documentation

## 2014-12-11 DIAGNOSIS — Z79899 Other long term (current) drug therapy: Secondary | ICD-10-CM | POA: Insufficient documentation

## 2014-12-11 LAB — CBC WITH DIFFERENTIAL/PLATELET
Basophils Absolute: 0.1 10*3/uL (ref 0.0–0.1)
Basophils Relative: 1 %
Eosinophils Absolute: 0.4 10*3/uL (ref 0.0–0.7)
Eosinophils Relative: 6 %
HCT: 34.5 % — ABNORMAL LOW (ref 39.0–52.0)
Hemoglobin: 11.8 g/dL — ABNORMAL LOW (ref 13.0–17.0)
Lymphocytes Relative: 27 %
Lymphs Abs: 1.8 10*3/uL (ref 0.7–4.0)
MCH: 30 pg (ref 26.0–34.0)
MCHC: 34.2 g/dL (ref 30.0–36.0)
MCV: 87.8 fL (ref 78.0–100.0)
Monocytes Absolute: 0.6 10*3/uL (ref 0.1–1.0)
Monocytes Relative: 9 %
NEUTROS ABS: 3.7 10*3/uL (ref 1.7–7.7)
NEUTROS PCT: 57 %
Platelets: 279 10*3/uL (ref 150–400)
RBC: 3.93 MIL/uL — AB (ref 4.22–5.81)
RDW: 12.6 % (ref 11.5–15.5)
WBC: 6.5 10*3/uL (ref 4.0–10.5)

## 2014-12-11 LAB — COMPREHENSIVE METABOLIC PANEL
ALT: 21 U/L (ref 17–63)
AST: 23 U/L (ref 15–41)
Albumin: 3.6 g/dL (ref 3.5–5.0)
Alkaline Phosphatase: 72 U/L (ref 38–126)
Anion gap: 9 (ref 5–15)
BUN: 19 mg/dL (ref 6–20)
CALCIUM: 8.9 mg/dL (ref 8.9–10.3)
CHLORIDE: 101 mmol/L (ref 101–111)
CO2: 25 mmol/L (ref 22–32)
CREATININE: 1.77 mg/dL — AB (ref 0.61–1.24)
GFR, EST AFRICAN AMERICAN: 44 mL/min — AB (ref >60)
GFR, EST NON AFRICAN AMERICAN: 38 mL/min — AB (ref >60)
Glucose, Bld: 249 mg/dL — ABNORMAL HIGH (ref 65–99)
Potassium: 4.2 mmol/L (ref 3.5–5.1)
Sodium: 135 mmol/L (ref 135–145)
Total Bilirubin: 0.7 mg/dL (ref 0.3–1.2)
Total Protein: 6.5 g/dL (ref 6.5–8.1)

## 2014-12-11 LAB — LIPASE, BLOOD: LIPASE: 41 U/L (ref 22–51)

## 2014-12-11 LAB — URINALYSIS, ROUTINE W REFLEX MICROSCOPIC
Bilirubin Urine: NEGATIVE
Glucose, UA: 500 mg/dL — AB
HGB URINE DIPSTICK: NEGATIVE
KETONES UR: NEGATIVE mg/dL
LEUKOCYTES UA: NEGATIVE
Nitrite: NEGATIVE
PROTEIN: NEGATIVE mg/dL
Specific Gravity, Urine: 1.017 (ref 1.005–1.030)
UROBILINOGEN UA: 0.2 mg/dL (ref 0.0–1.0)
pH: 6.5 (ref 5.0–8.0)

## 2014-12-11 LAB — TROPONIN I: Troponin I: <0.03 ng/mL (ref <0.031)

## 2014-12-11 MED ORDER — GI COCKTAIL ~~LOC~~
30.0000 mL | Freq: Once | ORAL | Status: AC
  Administered 2014-12-11: 30 mL via ORAL
  Filled 2014-12-11: qty 30

## 2014-12-11 MED ORDER — MORPHINE SULFATE (PF) 4 MG/ML IV SOLN
4.0000 mg | Freq: Once | INTRAVENOUS | Status: AC
  Administered 2014-12-11: 4 mg via INTRAVENOUS
  Filled 2014-12-11: qty 1

## 2014-12-11 MED ORDER — FAMOTIDINE 20 MG PO TABS: 20.0000 mg | ORAL_TABLET | Freq: Two times a day (BID) | ORAL | Status: DC

## 2014-12-11 MED ORDER — ESOMEPRAZOLE MAGNESIUM 40 MG PO CPDR: 40.0000 mg | DELAYED_RELEASE_CAPSULE | Freq: Every day | ORAL | Status: AC

## 2014-12-11 MED ORDER — SODIUM CHLORIDE 0.9 % IV BOLUS (SEPSIS)
1000.0000 mL | Freq: Once | INTRAVENOUS | Status: AC
  Administered 2014-12-11: 1000 mL via INTRAVENOUS

## 2014-12-11 MED ORDER — SUCRALFATE 1 GM/10ML PO SUSP: 1.0000 g | Freq: Three times a day (TID) | ORAL | Status: AC

## 2014-12-11 NOTE — Discharge Instructions (Signed)
Take nexium daily.   Take pepcid twice daily.   Take carafate for pain.   See GI doctor.   Return to ER if you have worse abdominal pain, vomiting, fever, trouble urinating.

## 2014-12-11 NOTE — ED Notes (Signed)
Pt from home with c/o continuing abdominal pain s/p hospitalization with kidney infection.  Pt states that his home health nurse advised him to be seen for his abdominal pain and that he has a "knot" on his left side.  Pt denies any changes in pain or symptoms since onset a month or more ago.  Reports when he eats he feels "gassy," with worse pain in the evenings.  Pt in NAD, A&O, ambulatory to room.

## 2014-12-11 NOTE — ED Provider Notes (Signed)
CSN: [id number]     Arrival date & time 12/11/14  0350 History   First MD Initiated Contact with Patient 12/11/14 [phone]     Chief Complaint  Patient presents with  . Abdominal Pain     (Consider location/radiation/quality/duration/timing/severity/associated sxs/prior Treatment) The history is provided by the patient.  BARUCH LEWERS is a 69 y.o. male hx of HTN, DM, here presenting with left flank pain, epigastric pain. Pain is worse after eating and radiate to the back. Patient states that this is not as severe as his kidney infection for which he was admitted a month ago. He also feels more gassy than usual. Denies any urinary symptoms and denies any pelvic pain. Denies any fevers or vomiting. Patient had 2 CT scans this past several months that showed pyelo and prostatitis. Patient was admitted a month ago for renal failure likely from pyelo.     Past Medical History  Diagnosis Date  . Hypertension   . Coronary artery disease   . Type II diabetes mellitus   . History of hiatal hernia   . GERD (gastroesophageal reflux disease)   . Acute kidney injury 10/22/2013   Past Surgical History  Procedure Laterality Date  . Appendectomy  1960's   History reviewed. No pertinent family history. Social History  Substance Use Topics  . Smoking status: Current Some Day Smoker -- 0.25 packs/day for 46 years    Types: Cigarettes  . Smokeless tobacco: Never Used  . Alcohol Use: Yes     Comment: 10/24/2014 "used to drink pretty much; now 1-2 drinks/month"    Review of Systems  Gastrointestinal: Positive for abdominal pain.  Genitourinary: Positive for flank pain.  All other systems reviewed and are negative.     Allergies  Vicodin  Home Medications   Prior to Admission medications   Medication Sig Start Date End Date Taking? Authorizing Provider  amLODipine (NORVASC) 10 MG tablet Take 1 tablet (10 mg total) by mouth daily. 10/25/14   Barton Dubois, MD  cefUROXime (CEFTIN) 500 MG tablet  Take 1 tablet (500 mg total) by mouth 2 (two) times daily with a meal. 10/25/14   Barton Dubois, MD  furosemide (LASIX) 20 MG tablet Take 1 tablet (20 mg total) by mouth daily. 10/25/14   Barton Dubois, MD  gabapentin (NEURONTIN) 100 MG capsule Take 1 capsule (100 mg total) by mouth 3 (three) times daily. 06/29/14   Nicole Pisciotta, PA-C  glipiZIDE (GLUCOTROL) 5 MG tablet Take 1 tablet (5 mg total) by mouth 2 (two) times daily before a meal. 10/25/14   Barton Dubois, MD  metroNIDAZOLE (FLAGYL) 500 MG tablet Take 1 tablet (500 mg total) by mouth every 8 (eight) hours. 10/25/14   Barton Dubois, MD  saccharomyces boulardii (FLORASTOR) 250 MG capsule Take 1 capsule (250 mg total) by mouth 2 (two) times daily. 10/25/14   Barton Dubois, MD  tamsulosin (FLOMAX) 0.4 MG CAPS capsule Take 1 capsule (0.4 mg total) by mouth daily after supper. 10/25/14   Barton Dubois, MD   BP 149/86 mmHg  Pulse 74  Temp(Src) 97.9 F (36.6 C) (Oral)  Resp 16  SpO2 100% Physical Exam  Constitutional: He is oriented to person, place, and time. He appears well-developed and well-nourished.  Uncomfortable   HENT:  Head: Normocephalic.  Mouth/Throat: Oropharynx is clear and moist.  Eyes: Conjunctivae are normal. Pupils are equal, round, and reactive to light.  Neck: Normal range of motion. Neck supple.  Cardiovascular: Normal rate, regular rhythm and normal heart  sounds.   Pulmonary/Chest: Effort normal and breath sounds normal. No respiratory distress. He has no wheezes. He has no rales.  Abdominal: Soft. Bowel sounds are normal.  Mild epigastric and LUQ and L flank tenderness. No rebound. No lower abdominal tenderness   Musculoskeletal: Normal range of motion. He exhibits no edema or tenderness.  Neurological: He is alert and oriented to person, place, and time. No cranial nerve deficit. Coordination normal.  Skin: Skin is warm and dry.  Psychiatric: He has a normal mood and affect. His behavior is normal. Judgment and  thought content normal.  Nursing note and vitals reviewed.   ED Course  Procedures (including critical care time) Labs Review Labs Reviewed  CBC WITH DIFFERENTIAL/PLATELET - Abnormal; Notable for the following:    RBC 3.93 (*)    Hemoglobin 11.8 (*)    HCT 34.5 (*)    All other components within normal limits  COMPREHENSIVE METABOLIC PANEL - Abnormal; Notable for the following:    Glucose, Bld 249 (*)    Creatinine, Ser 1.77 (*)    GFR calc non Af Amer 38 (*)    GFR calc Af Amer 44 (*)    All other components within normal limits  URINALYSIS, ROUTINE W REFLEX MICROSCOPIC (NOT AT [facility]) - Abnormal; Notable for the following:    Glucose, UA 500 (*)    All other components within normal limits  URINE CULTURE  LIPASE, BLOOD  TROPONIN I  I-STAT TROPOININ, ED    Imaging Review US Abdomen Complete  12/11/2014   CLINICAL DATA:  Abdominal and left flank pain for the past month  EXAM: ULTRASOUND ABDOMEN COMPLETE  COMPARISON:  Abdominal pelvic CT scan of October 23, 2014 and September 22, 2014.  FINDINGS: Gallbladder: No gallstones or wall thickening visualized. No sonographic Murphy sign noted.  Common bile duct: Diameter: 3.3 mm  Liver: There is a mixed echogenicity hypoechoic focus in the left hepatic lobe. This was previously demonstrated is area of decreased density on the CT scan. It measures approximately 2.2 x 2.7 x 4.1 cm.  IVC: No abnormality visualized.  Pancreas: The bowel gas obscures the pancreatic head and tail. The visualized portions of the pancreatic body are normal.  Spleen: Size and appearance within normal limits.  Right Kidney: Length: 10.9 cm. Echogenicity is approximately equal to that of the liver. No hydronephrosis visualized. Two subcentimeter cortical cysts are demonstrated.  Left Kidney: Length: 10.9 cm. Echogenicity is increased similar to that on the right. No mass or hydronephrosis visualized.  Abdominal aorta: No aneurysm visualized.  Other findings: There is no ascites.   IMPRESSION: 1. Stable left lobe mass exhibiting mixed echogenicity. The enhancement pattern on the July 2016 CT scan with duct be most compatible with a hemangioma. The hepatic echotexture elsewhere is mildly increased which likely reflects fatty infiltration. 2. The gallbladder, common bile duct, and spleen are unremarkable. The pancreas was partially obscured by bowel gas. 3. The renal cortical echogenicity is mildly increased which may reflect medical renal disease. There is no hydronephrosis. Subcentimeter cortical cysts are present on the right.   Electronically Signed   By: David  Martinique M.D.   On: 12/11/2014 10:18   I have personally reviewed and evaluated these images and lab results as part of my medical decision-making.   EKG Interpretation   Date/Time:  Tuesday December 11 2014 07:47:57 EDT Ventricular Rate:  84 PR Interval:  168 QRS Duration: 90 QT Interval:  386 QTC Calculation: 456 R Axis:   83  Text Interpretation:  Sinus rhythm Borderline right axis deviation No  significant change since last tracing Confirmed by YAO  MD, DAVID ([id number])  on 12/11/2014 8:21:30 AM      MDM   Final diagnoses:  None    ZIYAN SCHOON is a 69 y.o. male here with epigastric pain, L flank pain. Consider pyelo vs gastric ulcer vs chole, less likely ACS. Will get labs, UA, CMP, lipase, [place]. Will give pain meds and reassess.   12:08 PM UA nl, labs showed Cr 1.7, baseline 1.5 (was 2.1 on admission a month ago). US unremarkable. Pain controlled. Likely gastric ulcer vs gastritis. Will dc home with pepcid, nexium, carafate, GI f/u.    Wandra Arthurs, MD 12/11/14 1213

## 2014-12-12 LAB — URINE CULTURE: Culture: NO GROWTH

## 2015-01-07 DIAGNOSIS — G589 Mononeuropathy, unspecified: Secondary | ICD-10-CM | POA: Diagnosis not present

## 2015-01-07 DIAGNOSIS — E1165 Type 2 diabetes mellitus with hyperglycemia: Secondary | ICD-10-CM | POA: Diagnosis not present

## 2015-01-07 DIAGNOSIS — K219 Gastro-esophageal reflux disease without esophagitis: Secondary | ICD-10-CM | POA: Diagnosis not present

## 2015-01-07 DIAGNOSIS — N400 Enlarged prostate without lower urinary tract symptoms: Secondary | ICD-10-CM | POA: Diagnosis not present

## 2015-01-07 DIAGNOSIS — I10 Essential (primary) hypertension: Secondary | ICD-10-CM | POA: Diagnosis not present

## 2015-02-13 ENCOUNTER — Encounter: Payer: Self-pay | Admitting: Internal Medicine

## 2015-02-18 DIAGNOSIS — M179 Osteoarthritis of knee, unspecified: Secondary | ICD-10-CM | POA: Diagnosis not present

## 2015-02-18 DIAGNOSIS — G589 Mononeuropathy, unspecified: Secondary | ICD-10-CM | POA: Diagnosis not present

## 2015-02-18 DIAGNOSIS — E1165 Type 2 diabetes mellitus with hyperglycemia: Secondary | ICD-10-CM | POA: Diagnosis not present

## 2015-02-18 DIAGNOSIS — I10 Essential (primary) hypertension: Secondary | ICD-10-CM | POA: Diagnosis not present

## 2015-04-16 ENCOUNTER — Ambulatory Visit (INDEPENDENT_AMBULATORY_CARE_PROVIDER_SITE_OTHER): Payer: Medicare Other | Admitting: Internal Medicine

## 2015-04-16 ENCOUNTER — Encounter: Payer: Self-pay | Admitting: Internal Medicine

## 2015-04-16 VITALS — BP 130/74 | HR 76 | Ht 69.25 in | Wt 169.0 lb

## 2015-04-16 DIAGNOSIS — R1033 Periumbilical pain: Secondary | ICD-10-CM

## 2015-04-16 DIAGNOSIS — R194 Change in bowel habit: Secondary | ICD-10-CM | POA: Diagnosis not present

## 2015-04-16 NOTE — Progress Notes (Signed)
   Subjective:    Patient ID: Leonard Stone, male    DOB: 1945/03/27, 70 y.o.   MRN: [id number]  CC: abdominal pain, heartburn HPI Patient is a 70 yo male who has a PMH of uncontrolled DM, HTN, and CAD presents with a 4 month history of abdominal and back pain. In August he had pyelonephritis and was hospitalized - in September he went back to the ER for abdominal pain. Patient was given metronidazole, Nexium, Pepcid, Carafate. Since starting the Nexium, the heartburn and abdominal pain are better. The abdominal pain is localized mostly to the periumbilical region. He states the pain can be worse with food, but he says the pain normally lasts all day. It feels like "an elephant is stepping on my stomach." He has also lost some weight and early satiety in the past months. Patient also has episodes of constipation followed by 3 episodes of diarrhea. He says he must strain and then has hard little stool. He can go 2-3 days without a BM. He denies blood in the stool or dark tarry stool.  Patient also has back pain that feels like "sharp, pinching" all over his back. He denies trauma to this area.   Medications, allergies, past medical history, past surgical history, family history and social history are reviewed and updated in the EMR.  Review of Systems  Constitutional: Positive for appetite change and unexpected weight change.  Gastrointestinal: Positive for vomiting, abdominal pain (periumbilical), diarrhea and constipation. Negative for nausea and blood in stool.       Heartburn   Genitourinary: Negative for dysuria and flank pain.  All other systems reviewed and are negative.      Objective:   Physical Exam Filed Vitals:   04/16/15 0840  BP: 130/74  Pulse: 76    Constitutional: Appears well-developed and well-nourished.  HENT:  Head: Normocephalic and atraumatic.  Eyes: No scleral icterus.  Neck: Supple No tracheal deviation present. No thyromegaly, no cervical adenopathy.  Mouth:  Absent some teeth, no lesions Cardiovascular: Normal rate, regular rhythm and normal heart sounds.  No murmurs, rubs, or gallops Abdominal: Soft. Bowel sounds are normal. Patient exhibits no distension, no tenderness, and no mass. There is no rebound and no guarding. No CVA tenderness.  Neurological: Patient is alert and oriented to person, place, and time.  Skin: Skin is warm and dry.  Psychiatric: Patient has a normal mood and affect. The behavior is normal. Judgment and thought content normal.  Data reviewed include emergency room visit hospitalization records labs imaging in the EMR.      Assessment & Plan:  Periumbilical abdominal pain  Change in bowel habits  CBC, CMET Given his change in bowel habits, early satiety and periumbilical pain, he needs to be set up for a colonoscopy and endoscopy. The risks and benefits for the procedures were reviewed with him and he was agreeable to proceed with the procedures. It's quite possible that he has functional GI disturbance related to diabetes mellitus type 2 uncontrolled. However more serious problems like gastrointestinal neoplasia needs to be excluded. He should continue the Nexium for the heartburn. Patient should see PCP for chronic back pain and diabetes follow-up.  I appreciate the opportunity to care for this patient. CC: Philis Fendt, MD

## 2015-04-16 NOTE — Patient Instructions (Signed)
  You have been scheduled for an endoscopy and colonoscopy. Please follow the written instructions given to you at your visit today. Please pick up your over the counter prep supplies at the pharmacy. If you use inhalers (even only as needed), please bring them with you on the day of your procedure. Your physician has requested that you go to www.startemmi.com and enter the access code given to you at your visit today. This web site gives a general overview about your procedure. However, you should still follow specific instructions given to you by our office regarding your preparation for the procedure.   Your physician has requested that you go to the basement for the following lab work before leaving today: CBC/diff, CMET    I appreciate the opportunity to care for you. Silvano Rusk, MD, [facility]

## 2015-06-13 ENCOUNTER — Encounter: Payer: Self-pay | Admitting: Internal Medicine

## 2015-06-13 ENCOUNTER — Ambulatory Visit (AMBULATORY_SURGERY_CENTER): Payer: Medicare Other | Admitting: Internal Medicine

## 2015-06-13 VITALS — BP 151/88 | HR 70 | Temp 99.5°F | Resp 14 | Ht 69.0 in | Wt 169.0 lb

## 2015-06-13 DIAGNOSIS — Z860101 Personal history of adenomatous and serrated colon polyps: Secondary | ICD-10-CM

## 2015-06-13 DIAGNOSIS — D120 Benign neoplasm of cecum: Secondary | ICD-10-CM | POA: Diagnosis not present

## 2015-06-13 DIAGNOSIS — D123 Benign neoplasm of transverse colon: Secondary | ICD-10-CM

## 2015-06-13 DIAGNOSIS — R1033 Periumbilical pain: Secondary | ICD-10-CM

## 2015-06-13 DIAGNOSIS — Z86010 Personal history of colonic polyps: Secondary | ICD-10-CM

## 2015-06-13 DIAGNOSIS — R194 Change in bowel habit: Secondary | ICD-10-CM | POA: Diagnosis not present

## 2015-06-13 HISTORY — DX: Personal history of adenomatous and serrated colon polyps: Z86.0101

## 2015-06-13 HISTORY — DX: Personal history of colonic polyps: Z86.010

## 2015-06-13 LAB — GLUCOSE, CAPILLARY
Glucose-Capillary: 125 mg/dL — ABNORMAL HIGH (ref 65–99)
Glucose-Capillary: 77 mg/dL (ref 65–99)

## 2015-06-13 MED ORDER — DICYCLOMINE HCL 20 MG PO TABS: 20.0000 mg | ORAL_TABLET | Freq: Four times a day (QID) | ORAL | Status: AC | PRN

## 2015-06-13 MED ORDER — SODIUM CHLORIDE 0.9 % IV SOLN: 500.0000 mL | INTRAVENOUS | Status: DC

## 2015-06-13 NOTE — Op Note (Signed)
[place] Patient Name: Leonard Stone Procedure Date: 06/13/2015 4:10 PM MRN: [id number] Endoscopist: Gatha Mayer , MD Age: 70 Referring MD:  Date of Birth: 07/15/1945 Gender: Male Procedure:                Colonoscopy Indications:              Incidental change in bowel habits noted Medicines:                Propofol per Anesthesia, Monitored Anesthesia Care Procedure:                Pre-Anesthesia Assessment:                           - Prior to the procedure, a History and Physical                            was performed, and patient medications and                            allergies were reviewed. The patient's tolerance of                            previous anesthesia was also reviewed. The risks                            and benefits of the procedure and the sedation                            options and risks were discussed with the patient.                            All questions were answered, and informed consent                            was obtained. Prior Anticoagulants: The patient has                            taken no previous anticoagulant or antiplatelet                            agents. ASA Grade Assessment: II - A patient with                            mild systemic disease. After reviewing the risks                            and benefits, the patient was deemed in                            satisfactory condition to undergo the procedure.                           - Prior to the procedure, a History and Physical  was performed, and patient medications and                            allergies were reviewed. The patient's tolerance of                            previous anesthesia was also reviewed. The risks                            and benefits of the procedure and the sedation                            options and risks were discussed with the patient.                            All questions were answered,  and informed consent                            was obtained. Prior Anticoagulants: The patient has                            taken no previous anticoagulant or antiplatelet                            agents. ASA Grade Assessment: II - A patient with                            mild systemic disease. After reviewing the risks                            and benefits, the patient was deemed in                            satisfactory condition to undergo the procedure.                           After obtaining informed consent, the colonoscope                            was passed under direct vision. Throughout the                            procedure, the patient's blood pressure, pulse, and                            oxygen saturations were monitored continuously. The                            Model CF-HQ190L [id number]) scope was introduced                            through the anus and advanced to the the cecum,  identified by appendiceal orifice and ileocecal                            valve. The colonoscopy was performed without                            difficulty. The patient tolerated the procedure                            well. The quality of the bowel preparation was                            good. The bowel preparation used was Miralax. The                            ileocecal valve, appendiceal orifice, and rectum                            were photographed. Scope In: 4:11:02 PM Scope Out: 4:25:08 PM Scope Withdrawal Time: 0 hours 10 minutes 44 seconds  Total Procedure Duration: 0 hours 14 minutes 6 seconds  Findings:      The perianal and digital rectal examinations were normal. Pertinent       negatives include normal prostate (size, shape, and consistency).      A 10 mm polyp was found in the transverse colon. The polyp was sessile.       The polyp was removed with a hot snare. Resection and retrieval were       complete. Verification of  patient identification for the specimen was       done. Estimated blood loss: none.      A 4 mm polyp was found in the cecum. The polyp was sessile. The polyp       was removed with a cold snare. Resection and retrieval were complete.       Verification of patient identification for the specimen was done.       Estimated blood loss: none.      Diverticula were found in the sigmoid colon.      The exam was otherwise without abnormality on direct and retroflexion       views. Complications:            No immediate complications. Estimated Blood Loss:     Estimated blood loss: none. Impression:               - One 10 mm polyp in the transverse colon, removed                            with a hot snare. Resected and retrieved.                           - One 4 mm polyp in the cecum, removed with a cold                            snare. Resected and retrieved.                           -  Mild diverticulosis in the sigmoid colon.                           - The examination was otherwise normal on direct                            and retroflexion views. Recommendation:           - Patient has a contact number available for                            emergencies. The signs and symptoms of potential                            delayed complications were discussed with the                            patient. Return to normal activities tomorrow.                            Written discharge instructions were provided to the                            patient.                           - Resume previous diet.                           - Continue present medications.                           - Repeat colonoscopy is recommended. The                            colonoscopy date will be determined after pathology                            results from today's exam become available for                            review.                           - High fiber diet.                           - Use  Benefiber 1-2 tablespoons daily PO                            [Frequency] indefinitely. Procedure Code(s):        --- Professional ---                           [id number], Colonoscopy, flexible; with removal of  tumor(s), polyp(s), or other lesion(s) by snare                            technique CPT copyright 2016 [employer]. All rights reserved. Gatha Mayer, MD 06/13/2015 4:42:25 PM This report has been signed electronically. Number of Addenda: 0 CC Letter to:             Edwin A. Avbuere Referring MD:      Philis Pique. Vickki Muff, MD

## 2015-06-13 NOTE — Patient Instructions (Addendum)
I found some colon polyps and removed them - do not look like cancer. You also have a condition called diverticulosis - common and not usually a problem. Please read the handout provided.  There is a hiatal hernia in the stomach.  All else ok.  I will let you know pathology results and when to have another routine colonoscopy by mail.  I have prescribed dicyclomine to take for the stomach and chest pains you get.  Please increase fiber in your diet and also take a fiber supplement like Benefiber 1-2 tablespoons a day - this will help bowels move better.  Avoid all NSAID'S (aspirin, aspirin products, and anti-inflammatory drugs) for 7 days.  I appreciate the opportunity to care for you. Gatha Mayer, MD, FACG  YOU HAD AN ENDOSCOPIC PROCEDURE TODAY AT [facility]:   Refer to the procedure report that was given to you for any specific questions about what was found during the examination.  If the procedure report does not answer your questions, please call your gastroenterologist to clarify.  If you requested that your care partner not be given the details of your procedure findings, then the procedure report has been included in a sealed envelope for you to review at your convenience later.  YOU SHOULD EXPECT: Some feelings of bloating in the abdomen. Passage of more gas than usual.  Walking can help get rid of the air that was put into your GI tract during the procedure and reduce the bloating. If you had a lower endoscopy (such as a colonoscopy or flexible sigmoidoscopy) you may notice spotting of blood in your stool or on the toilet paper. If you underwent a bowel prep for your procedure, you may not have a normal bowel movement for a few days.  Please Note:  You might notice some irritation and congestion in your nose or some drainage.  This is from the oxygen used during your procedure.  There is no need for concern and it should clear up in a day or  so.  SYMPTOMS TO REPORT IMMEDIATELY:   Following lower endoscopy (colonoscopy or flexible sigmoidoscopy):  Excessive amounts of blood in the stool  Significant tenderness or worsening of abdominal pains  Swelling of the abdomen that is new, acute  Fever of 100F or higher   Following upper endoscopy (EGD)  Vomiting of blood or coffee ground material  New chest pain or pain under the shoulder blades  Painful or persistently difficult swallowing  New shortness of breath  Fever of 100F or higher  Black, tarry-looking stools  For urgent or emergent issues, a gastroenterologist can be reached at any hour by calling [phone].   DIET: Your first meal following the procedure should be a small meal and then it is ok to progress to your normal diet. Heavy or fried foods are harder to digest and may make you feel nauseous or bloated.  Likewise, meals heavy in dairy and vegetables can increase bloating.  Drink plenty of fluids but you should avoid alcoholic beverages for 24 hours.  ACTIVITY:  You should plan to take it easy for the rest of today and you should NOT DRIVE or use heavy machinery until tomorrow (because of the sedation medicines used during the test).    FOLLOW UP: Our staff will call the number listed on your records the next business day following your procedure to check on you and address any questions or concerns that you may have regarding the information  given to you following your procedure. If we do not reach you, we will leave a message.  However, if you are feeling well and you are not experiencing any problems, there is no need to return our call.  We will assume that you have returned to your regular daily activities without incident.  If any biopsies were taken you will be contacted by phone or by letter within the next 1-3 weeks.  Please call us at [phone] if you have not heard about the biopsies in 3 weeks.    SIGNATURES/CONFIDENTIALITY: You and/or your  care partner have signed paperwork which will be entered into your electronic medical record.  These signatures attest to the fact that that the information above on your After Visit Summary has been reviewed and is understood.  Full responsibility of the confidentiality of this discharge information lies with you and/or your care-partner.

## 2015-06-13 NOTE — Op Note (Signed)
[place] Patient Name: Leonard Stone Procedure Date: 06/13/2015 4:03 PM MRN: [id number] Endoscopist: Gatha Mayer , MD Age: 70 Referring MD:  Date of Birth: 11/25/1945 Gender: Male Procedure:                Upper GI endoscopy Indications:              Periumbilical abdominal pain Medicines:                Propofol per Anesthesia, Monitored Anesthesia Care Procedure:                Pre-Anesthesia Assessment:                           - Prior to the procedure, a History and Physical                            was performed, and patient medications and                            allergies were reviewed. The patient's tolerance of                            previous anesthesia was also reviewed. The risks                            and benefits of the procedure and the sedation                            options and risks were discussed with the patient.                            All questions were answered, and informed consent                            was obtained. Prior Anticoagulants: The patient has                            taken no previous anticoagulant or antiplatelet                            agents. ASA Grade Assessment: II - A patient with                            mild systemic disease. After reviewing the risks                            and benefits, the patient was deemed in                            satisfactory condition to undergo the procedure.                           After obtaining informed consent, the endoscope was  passed under direct vision. Throughout the                            procedure, the patient's blood pressure, pulse, and                            oxygen saturations were monitored continuously. The                            Model GIF-HQ190 [id number]) scope was introduced                            through the mouth, and advanced to the second part                            of duodenum. The upper GI  endoscopy was                            accomplished without difficulty. The patient                            tolerated the procedure well. Scope In: Scope Out: Findings:      A small hiatal hernia was present.      The examined esophagus was moderately tortuous.      The exam was otherwise without abnormality.      The cardia and gastric fundus were normal on retroflexion. Complications:            No immediate complications. Estimated Blood Loss:     Estimated blood loss: none. Impression:               - Small hiatal hernia.                           - Tortuous esophagus.                           - The examination was otherwise normal.                           - No specimens collected. Recommendation:           - Patient has a contact number available for                            emergencies. The signs and symptoms of potential                            delayed complications were discussed with the                            patient. Return to normal activities tomorrow.                            Written discharge instructions were provided to the  patient.                           - Resume previous diet.                           - Continue present medications.                           - Use Bentyl (dicyclomine) 20 mg PO QID 30 min AC.                           - See the other procedure note for documentation of                            additional recommendations. Procedure Code(s):        --- Professional ---                           [id number], Esophagogastroduodenoscopy, flexible,                            transoral; diagnostic, including collection of                            specimen(s) by brushing or washing, when performed                            (separate procedure) CPT copyright 2016 American Medical Association. All rights reserved. Gatha Mayer, MD 06/13/2015 4:37:25 PM This report has been signed electronically. Number of  Addenda: 0 Referring MD:      Philis Pique. Vickki Muff, MD

## 2015-06-13 NOTE — Progress Notes (Signed)
Called to room to assist during endoscopic procedure.  Patient ID and intended procedure confirmed with present staff. Received instructions for my participation in the procedure from the performing physician.  

## 2015-06-13 NOTE — Progress Notes (Signed)
A and o x3 Report to RN 

## 2015-06-14 ENCOUNTER — Telehealth: Payer: Self-pay

## 2015-06-14 NOTE — Telephone Encounter (Signed)
  Follow up Call-  Call back number 06/13/2015  Post procedure Call Back phone  # [phone]  Permission to leave phone message Yes    Patient was called for follow up after procedure on 06/13/2015. No answer at the number given for follow up phone call. I was unable to leave a message due to mail box being full.

## 2015-06-20 ENCOUNTER — Encounter: Payer: Self-pay | Admitting: Internal Medicine

## 2015-06-20 DIAGNOSIS — Z86010 Personal history of colonic polyps: Secondary | ICD-10-CM

## 2015-06-20 NOTE — Progress Notes (Signed)
Quick Note:  2 adenomas max 10 mm recall 2020 ______

## 2015-08-25 IMAGING — CT CT ABD-PELV W/O CM
2 of 4 series · 15 of 46 positions shown, 17 images · non-contrast
Comparison: CT of the abdomen and pelvis performed 09/22/2014

CLINICAL DATA: Acute onset of left-sided abdominal pain and flank
pain. Initial encounter.

EXAM:
CT ABDOMEN AND PELVIS WITHOUT CONTRAST
TECHNIQUE: Multidetector CT imaging of the abdomen and pelvis was performed
following the standard protocol without IV contrast.

[Series 2: abd/ pelvis 5.0 i30f 1 · axial · 0.70mm/px · z∈[+728,+1168]mm · 12 of 97 slices shown, 14 images]
[im 5/97  soft-tissue]
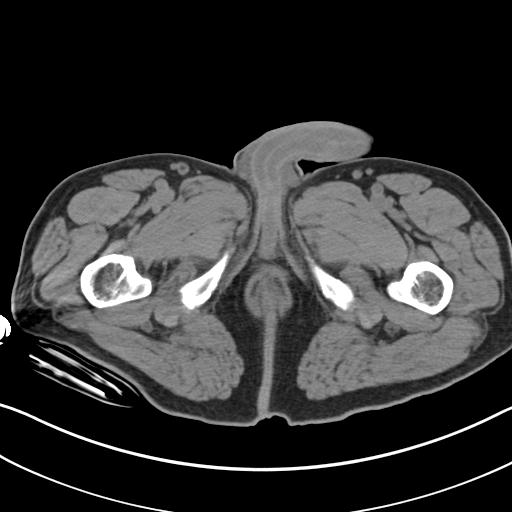
[im 5/97  bone]
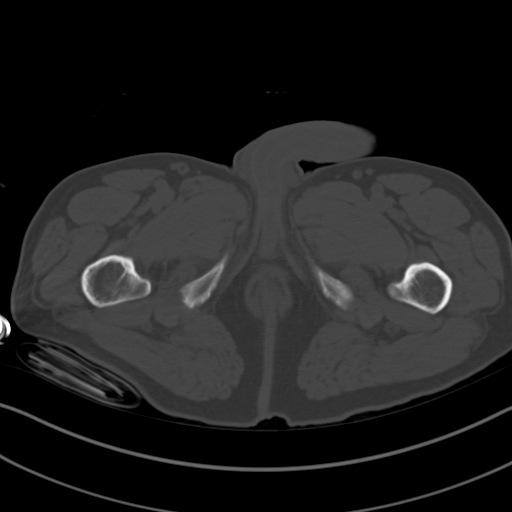
[im 13/97  soft-tissue]
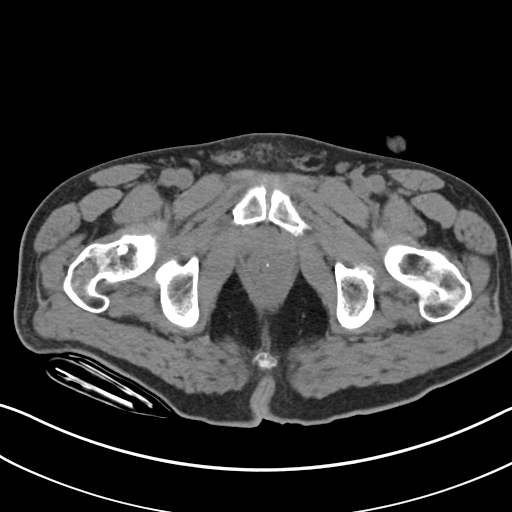
[im 21/97  soft-tissue]
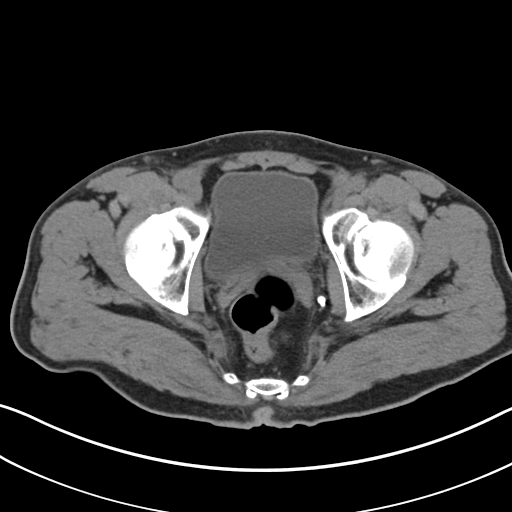
[im 29/97  soft-tissue]
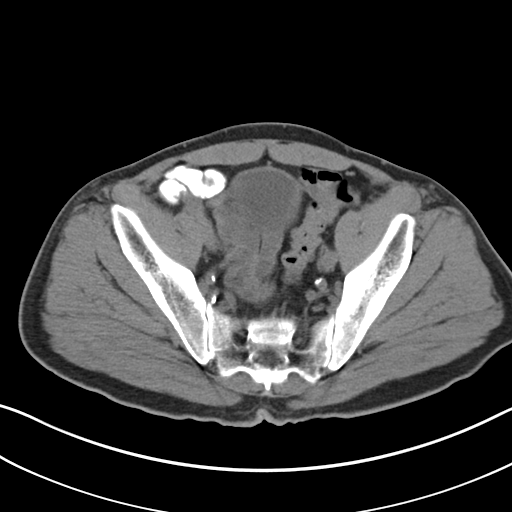
[im 37/97  soft-tissue]
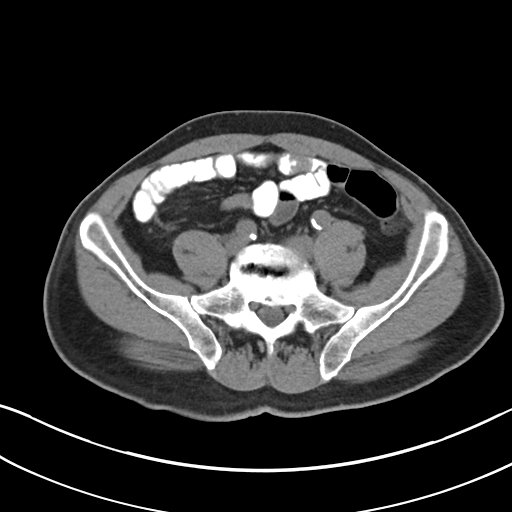
[im 45/97  soft-tissue]
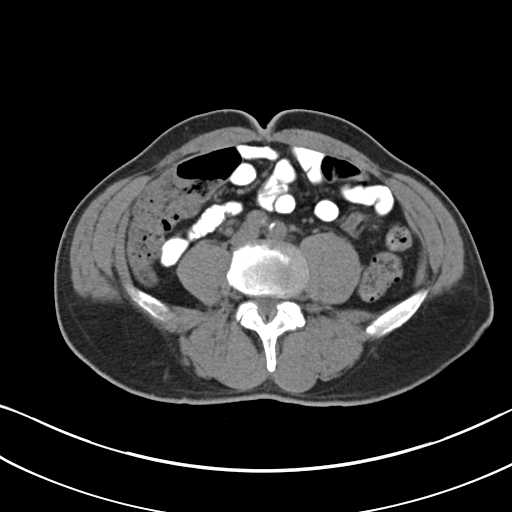
[im 53/97  soft-tissue]
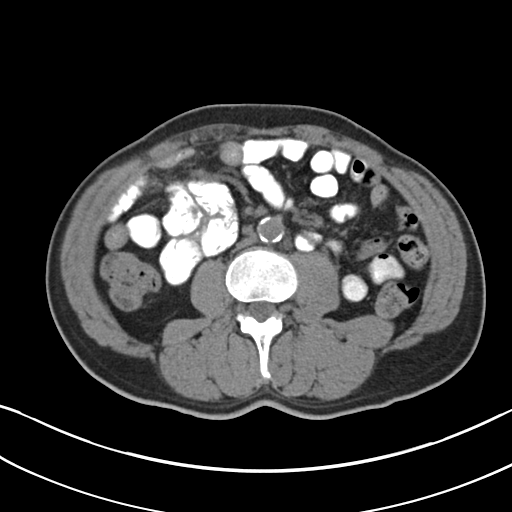
[im 61/97  soft-tissue]
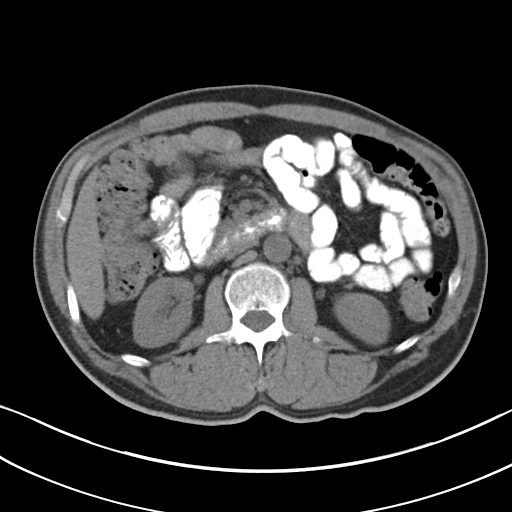
[im 69/97  soft-tissue]
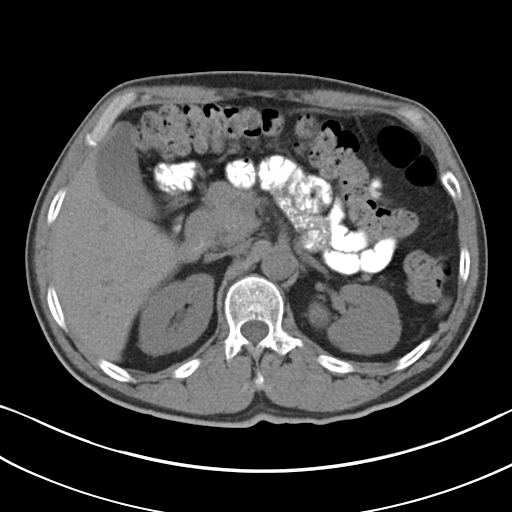
[im 69/97  bone]
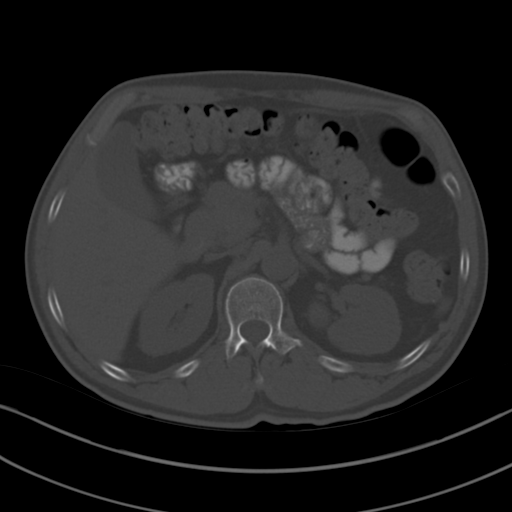
[im 77/97  soft-tissue]
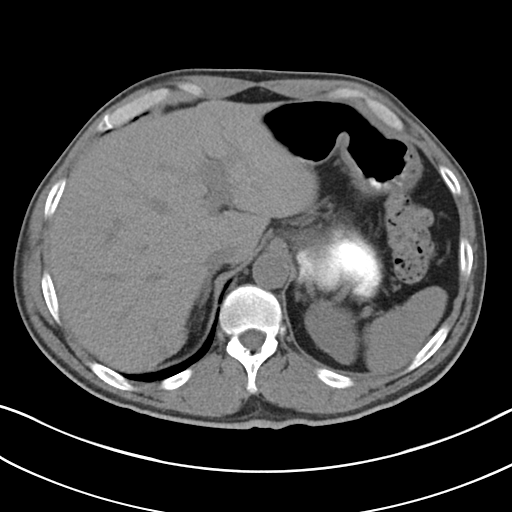
[im 85/97  soft-tissue]
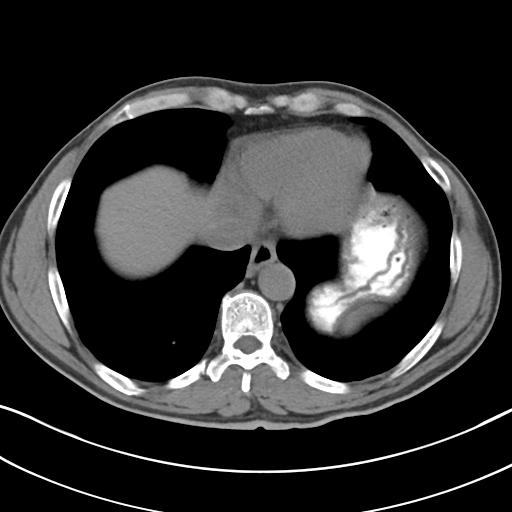
[im 93/97  soft-tissue]
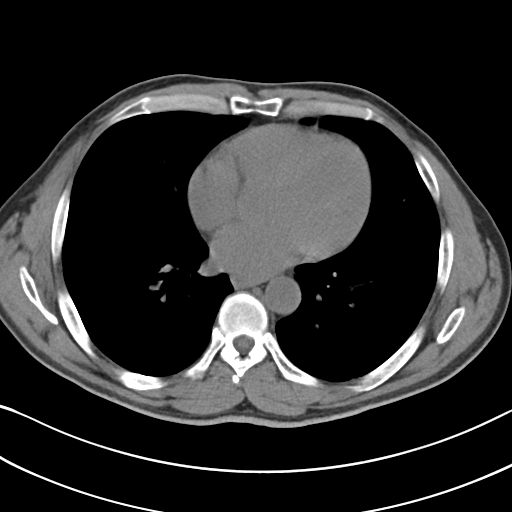

[Series 5: coronals · coronal · 0.66mm/px · 3 of 109 slices shown]
[im 37/109  soft-tissue]
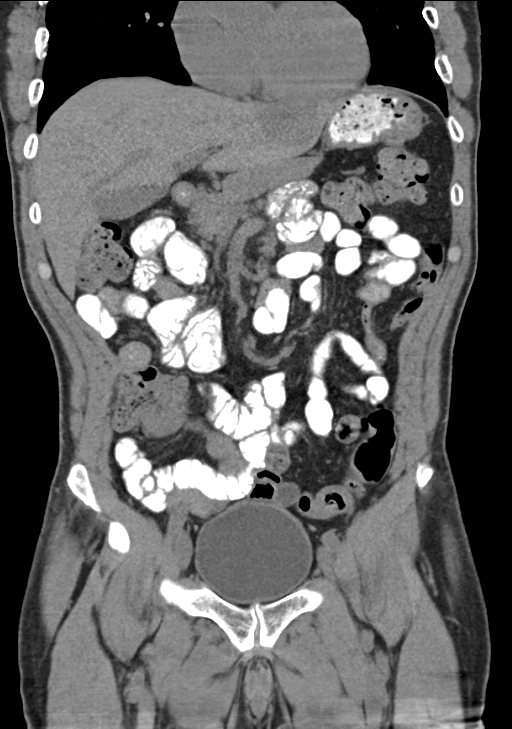
[im 49/109  soft-tissue]
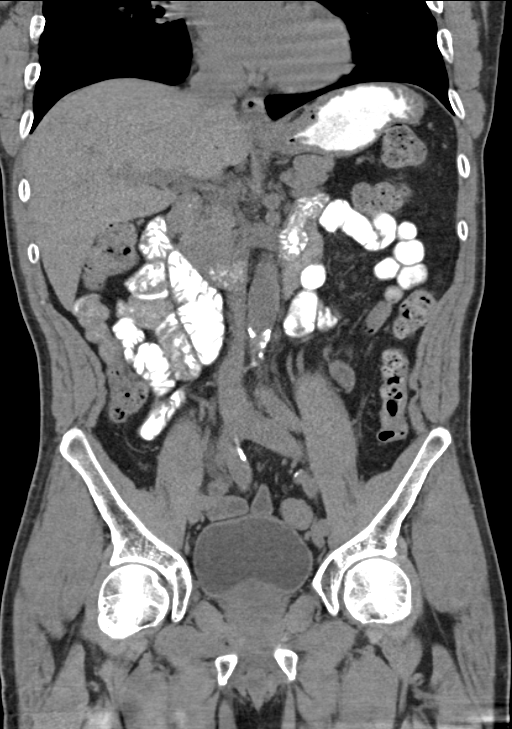
[im 61/109  soft-tissue]
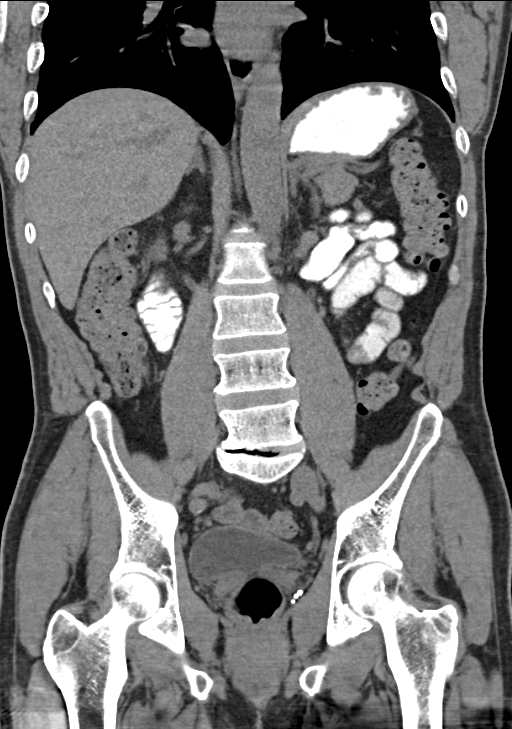

[15 of 46 positions shown; findings below may reference images not displayed]

FINDINGS: Minimal left basilar atelectasis is noted.

The patient's left hepatic lobe hemangioma measures 3.9 cm, similar
in appearance to the prior study. Minimal additional vague hepatic
hypodensities are stable in appearance. The spleen is unremarkable
in appearance. The gallbladder is within normal limits. The pancreas
and adrenal glands are unremarkable.

The kidneys are unremarkable in appearance. There is no evidence of
hydronephrosis. No renal or ureteral stones are seen. No perinephric
stranding is appreciated.

No free fluid is identified. The small bowel is unremarkable in
appearance. The stomach is within normal limits. No acute vascular
abnormalities are seen. Scattered calcification is noted along the
abdominal aorta and its branches.

The appendix is not definitely seen. The colon is unremarkable in
appearance.

The bladder is mildly distended and grossly unremarkable in
appearance. The prostate remains normal in size. Mild vague soft
tissue stranding about the prostate could reflect clinically
described prostatitis. No inguinal lymphadenopathy is seen.

No acute osseous abnormalities are identified. Vacuum phenomenon and
mild degenerative change are seen at L5-S1.
IMPRESSION: 1. Mild vague soft tissue stranding about the prostate could reflect
clinically described prostatitis.
2. Otherwise no acute abnormality seen within the abdomen or pelvis.
3. Left hepatic lobe hemangioma appears grossly stable. Additional
minimal vague hypodensities within the liver are stable and likely
benign.
4. Scattered calcification along the abdominal aorta and its
branches.

## 2016-01-23 IMAGING — US US ABDOMEN COMPLETE
1 series · 13 of 25 positions shown · non-contrast
Comparison: Abdominal pelvic CT scan October 23, 2014 and September 22, 2014.

CLINICAL DATA: Abdominal and left flank pain for the past month

EXAM:
ULTRASOUND ABDOMEN COMPLETE

[Series 1: us abdomen complete · 0.20mm/px · 13 of 71 slices shown]
[im 1/71]
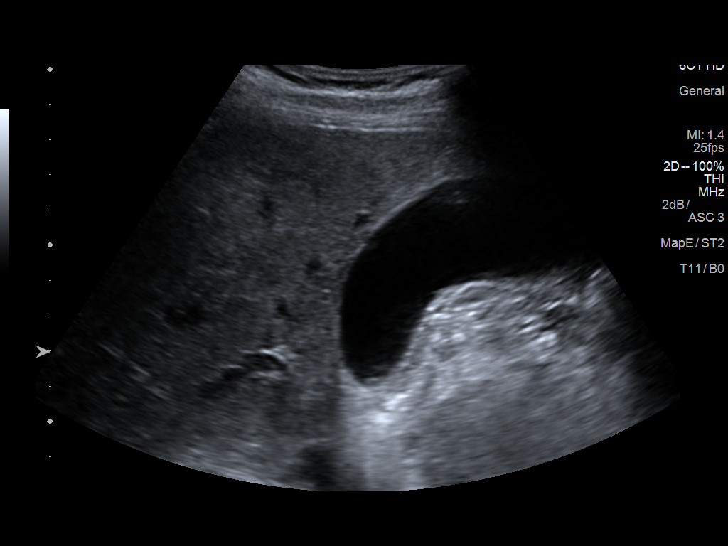
[im 6/71]
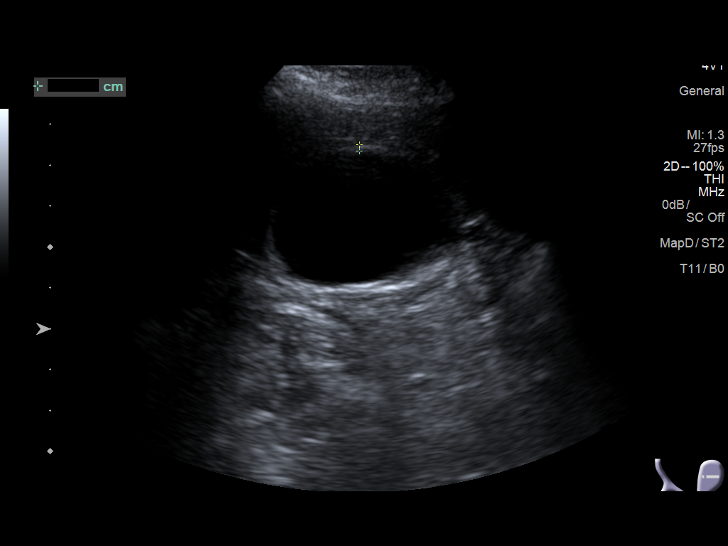
[im 12/71]
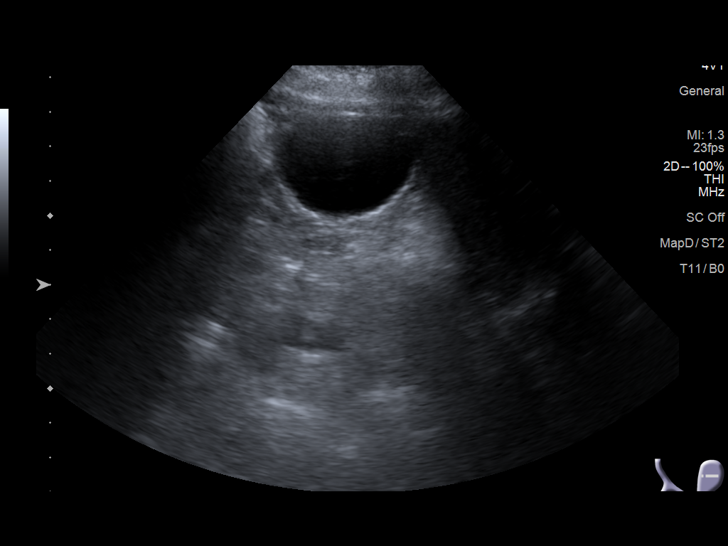
[im 18/71]
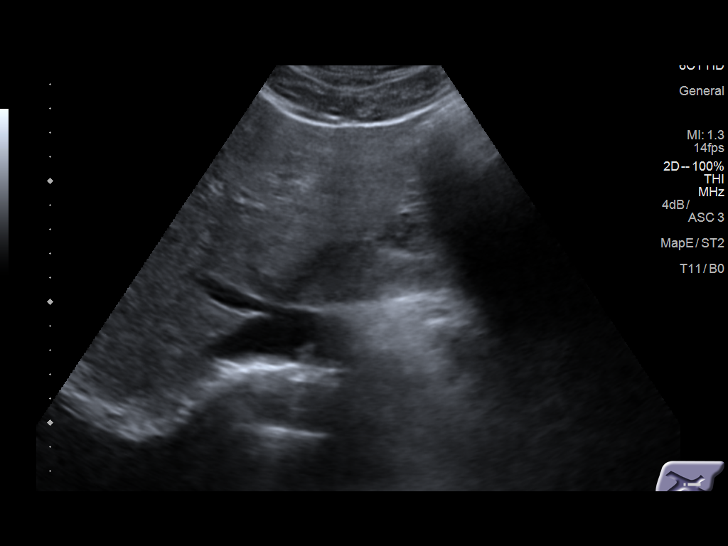
[im 24/71]
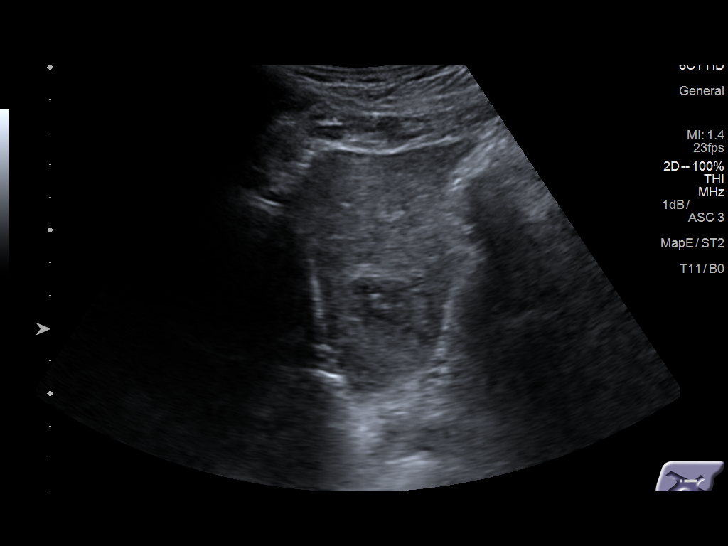
[im 30/71]
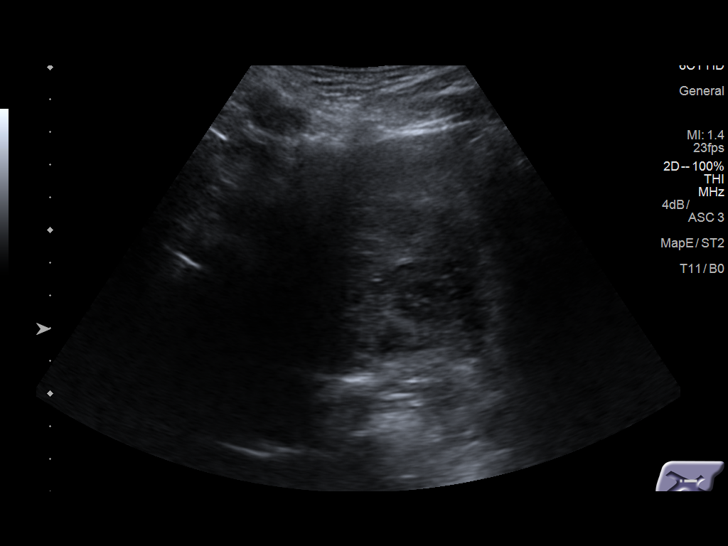
[im 36/71]
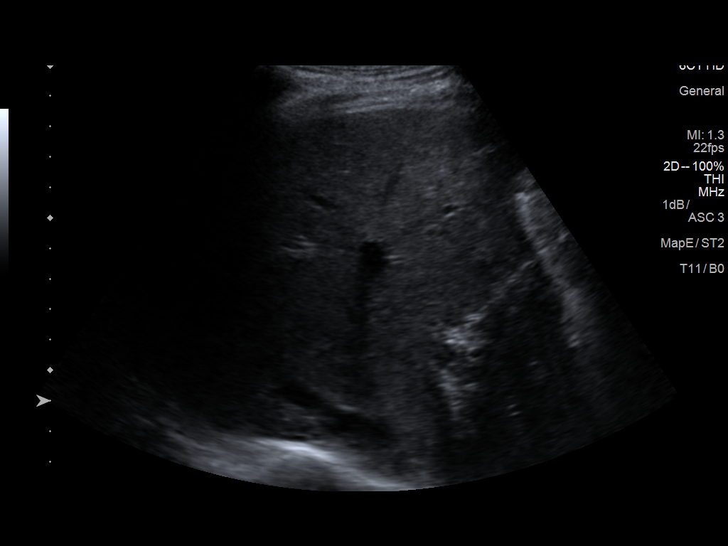
[im 41/71]
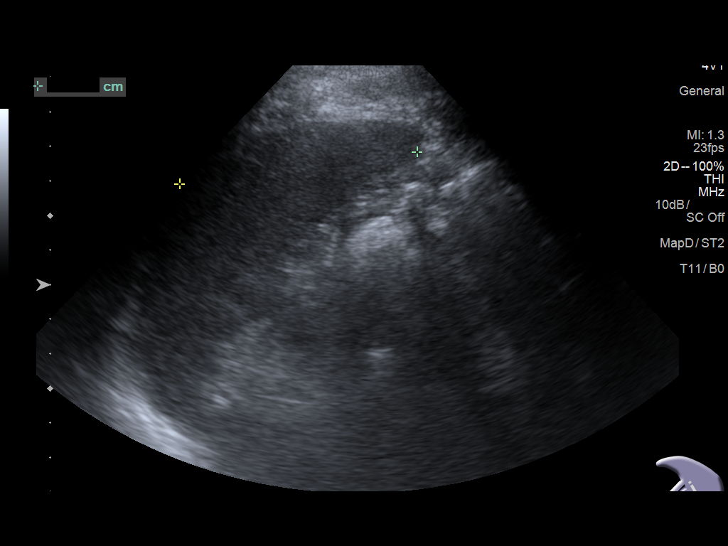
[im 47/71]
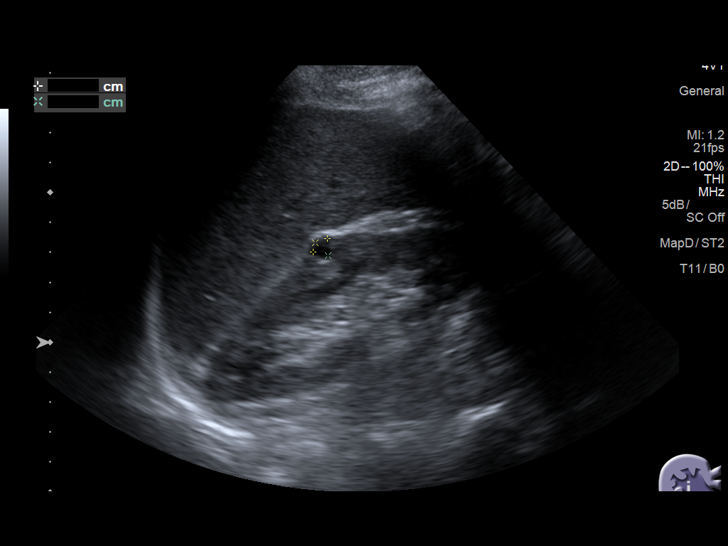
[im 53/71]
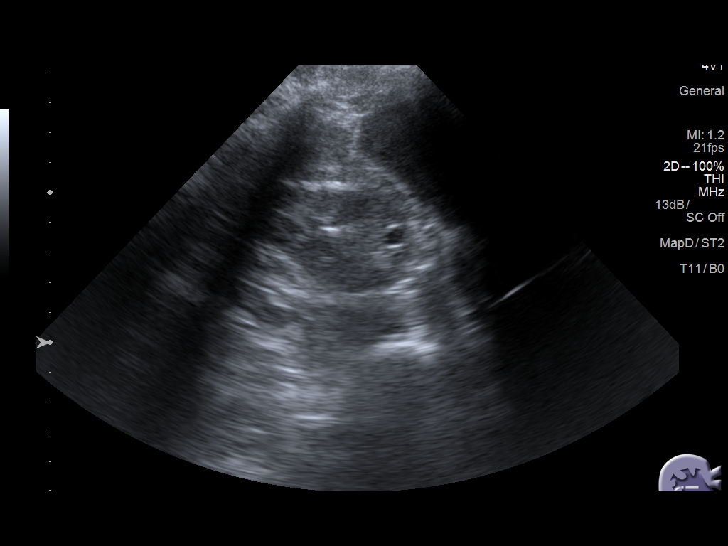
[im 59/71]
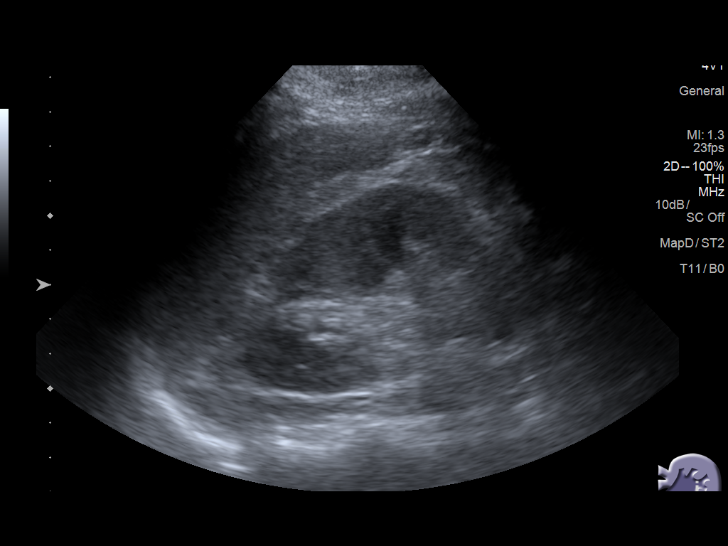
[im 65/71]
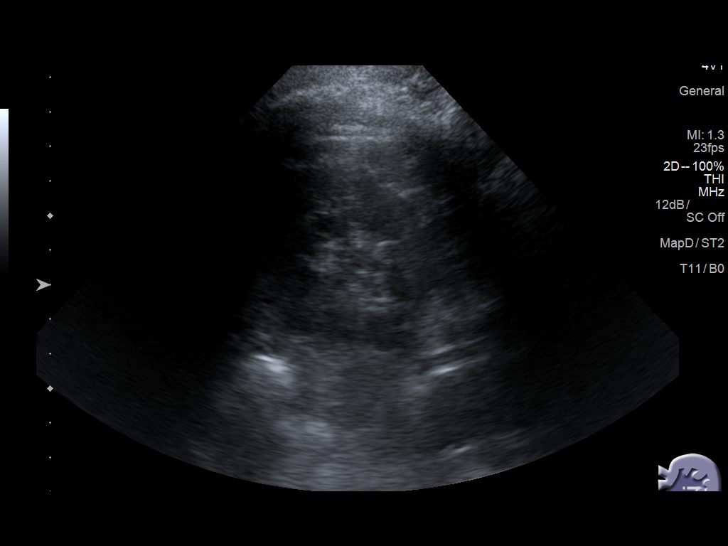
[im 71/71]
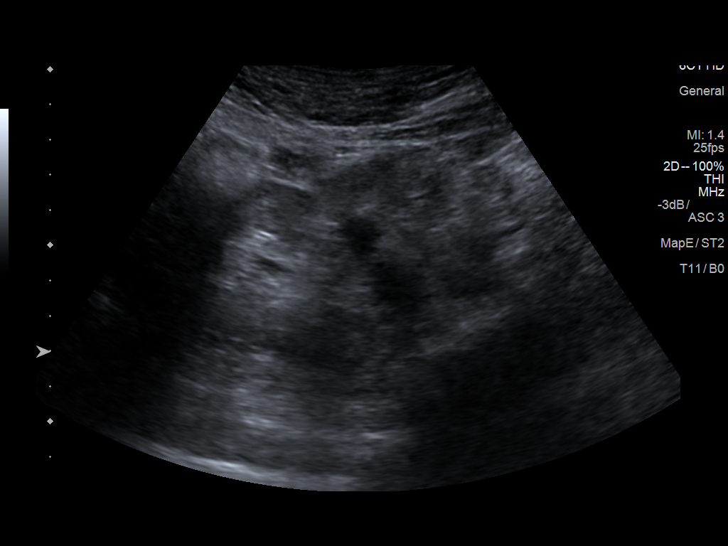

[13 of 25 positions shown; findings below may reference images not displayed]

FINDINGS: Gallbladder: No gallstones or wall thickening visualized. No
sonographic Murphy sign noted.

Common bile duct: Diameter: 3.3 mm

Liver: There is a mixed echogenicity hypoechoic focus in the left
hepatic lobe. This was previously demonstrated is area of decreased
density on the CT scan. It measures approximately 2.2 x 2.7 x
cm.

IVC: No abnormality visualized.

Pancreas: The bowel gas obscures the pancreatic head and tail. The
visualized portions of the pancreatic body are normal.

Spleen: Size and appearance within normal limits.

Right Kidney: Length: 10.9 cm. Echogenicity is approximately equal
to that of the liver. No hydronephrosis visualized. Two
subcentimeter cortical cysts are demonstrated.

Left Kidney: Length: 10.9 cm. Echogenicity is increased similar to
that on the right. No mass or hydronephrosis visualized.

Abdominal aorta: No aneurysm visualized.

Other findings: There is no ascites.
IMPRESSION: 1. Stable left lobe mass exhibiting mixed echogenicity. The
enhancement pattern on the September 2014 CT scan with duct be most
compatible with a hemangioma. The hepatic echotexture elsewhere is
mildly increased which likely reflects fatty infiltration.
2. The gallbladder, common bile duct, and spleen are unremarkable.
The pancreas was partially obscured by bowel gas.
3. The renal cortical echogenicity is mildly increased which may
reflect medical renal disease. There is no hydronephrosis.
Subcentimeter cortical cysts are present on the right.

## 2018-07-11 ENCOUNTER — Encounter: Payer: Self-pay | Admitting: Internal Medicine

## 2021-03-16 DEATH — deceased
